# Patient Record
Sex: Male | Born: 1935 | Race: White | Hispanic: No | Smoking: Never smoker
Health system: Southern US, Community
[De-identification: ages and names within clinical notes are randomized; demographics above are authoritative.]

## PROBLEM LIST (undated history)

## (undated) DIAGNOSIS — E0590 Thyrotoxicosis, unspecified without thyrotoxic crisis or storm: Secondary | ICD-10-CM

## (undated) DIAGNOSIS — R413 Other amnesia: Secondary | ICD-10-CM

## (undated) DIAGNOSIS — I10 Essential (primary) hypertension: Secondary | ICD-10-CM

## (undated) DIAGNOSIS — N200 Calculus of kidney: Secondary | ICD-10-CM

## (undated) HISTORY — DX: Other amnesia: R41.3

## (undated) HISTORY — DX: Thyrotoxicosis, unspecified without thyrotoxic crisis or storm: E05.90

## (undated) HISTORY — DX: Calculus of kidney: N20.0

---

## 2005-03-08 ENCOUNTER — Ambulatory Visit: Payer: Self-pay | Admitting: Family Medicine

## 2005-06-12 ENCOUNTER — Emergency Department: Payer: Self-pay | Admitting: Emergency Medicine

## 2005-06-15 ENCOUNTER — Emergency Department: Payer: Self-pay | Admitting: Emergency Medicine

## 2005-06-19 ENCOUNTER — Emergency Department: Payer: Self-pay | Admitting: Emergency Medicine

## 2005-06-26 ENCOUNTER — Emergency Department: Payer: Self-pay | Admitting: Emergency Medicine

## 2005-07-10 ENCOUNTER — Emergency Department: Payer: Self-pay | Admitting: Emergency Medicine

## 2006-12-08 ENCOUNTER — Emergency Department: Payer: Self-pay | Admitting: Unknown Physician Specialty

## 2006-12-08 ENCOUNTER — Other Ambulatory Visit: Payer: Self-pay

## 2008-09-03 ENCOUNTER — Ambulatory Visit: Payer: Self-pay | Admitting: Surgery

## 2008-09-03 ENCOUNTER — Other Ambulatory Visit: Payer: Self-pay

## 2008-09-10 ENCOUNTER — Ambulatory Visit: Payer: Self-pay | Admitting: Surgery

## 2012-10-17 ENCOUNTER — Ambulatory Visit: Payer: Self-pay | Admitting: Ophthalmology

## 2012-11-14 ENCOUNTER — Ambulatory Visit: Payer: Self-pay | Admitting: Ophthalmology

## 2012-11-28 HISTORY — PX: EYE SURGERY: SHX253

## 2012-11-28 HISTORY — PX: HERNIA REPAIR: SHX51

## 2013-01-17 ENCOUNTER — Emergency Department: Payer: Self-pay | Admitting: Emergency Medicine

## 2013-01-17 LAB — COMPREHENSIVE METABOLIC PANEL
BUN: 13 mg/dL (ref 7–18)
Bilirubin,Total: 0.5 mg/dL (ref 0.2–1.0)
Calcium, Total: 8.5 mg/dL (ref 8.5–10.1)
Chloride: 110 mmol/L — ABNORMAL HIGH (ref 98–107)
Co2: 25 mmol/L (ref 21–32)
Creatinine: 1.15 mg/dL (ref 0.60–1.30)
EGFR (African American): >60
EGFR (Non-African Amer.): 58 — ABNORMAL LOW
Glucose: 134 mg/dL — ABNORMAL HIGH (ref 65–99)
Osmolality: 283 (ref 275–301)
Sodium: 141 mmol/L (ref 136–145)
Total Protein: 6.8 g/dL (ref 6.4–8.2)

## 2013-01-17 LAB — URINALYSIS, COMPLETE
Glucose,UR: NEGATIVE mg/dL (ref 0–75)
Hyaline Cast: 1
Leukocyte Esterase: NEGATIVE
Ph: 6 (ref 4.5–8.0)
Protein: NEGATIVE
RBC,UR: 5 /HPF (ref 0–5)
Specific Gravity: 1.021 (ref 1.003–1.030)
Squamous Epithelial: <1
WBC UR: 2 /HPF (ref 0–5)

## 2013-01-17 LAB — CBC
HGB: 14.4 g/dL (ref 13.0–18.0)
MCH: 31.9 pg (ref 26.0–34.0)
MCV: 90 fL (ref 80–100)
RBC: 4.52 10*6/uL (ref 4.40–5.90)
WBC: 5.3 10*3/uL (ref 3.8–10.6)

## 2013-01-17 LAB — TROPONIN I: Troponin-I: <0.02 ng/mL

## 2014-09-30 ENCOUNTER — Inpatient Hospital Stay: Payer: Self-pay | Admitting: Surgery

## 2014-09-30 LAB — COMPREHENSIVE METABOLIC PANEL
ALT: 15 U/L
Albumin: 3.7 g/dL (ref 3.4–5.0)
Alkaline Phosphatase: 75 U/L
Anion Gap: 7 (ref 7–16)
BILIRUBIN TOTAL: 0.6 mg/dL (ref 0.2–1.0)
BUN: 14 mg/dL (ref 7–18)
CALCIUM: 8.6 mg/dL (ref 8.5–10.1)
CREATININE: 1.23 mg/dL (ref 0.60–1.30)
Chloride: 107 mmol/L (ref 98–107)
Co2: 29 mmol/L (ref 21–32)
GFR CALC NON AF AMER: 59 — AB
Glucose: 108 mg/dL — ABNORMAL HIGH (ref 65–99)
Osmolality: 286 (ref 275–301)
Potassium: 3.9 mmol/L (ref 3.5–5.1)
SGOT(AST): 28 U/L (ref 15–37)
SODIUM: 143 mmol/L (ref 136–145)
TOTAL PROTEIN: 6.9 g/dL (ref 6.4–8.2)

## 2014-09-30 LAB — CBC
HCT: 40.2 % (ref 40.0–52.0)
HGB: 13.8 g/dL (ref 13.0–18.0)
MCH: 31.7 pg (ref 26.0–34.0)
MCHC: 34.4 g/dL (ref 32.0–36.0)
MCV: 92 fL (ref 80–100)
Platelet: 129 10*3/uL — ABNORMAL LOW (ref 150–440)
RBC: 4.37 10*6/uL — ABNORMAL LOW (ref 4.40–5.90)
RDW: 13.1 % (ref 11.5–14.5)
WBC: 5.1 10*3/uL (ref 3.8–10.6)

## 2014-09-30 LAB — PROTIME-INR
INR: 1
Prothrombin Time: 13.1 secs (ref 11.5–14.7)

## 2014-10-01 LAB — BASIC METABOLIC PANEL
Anion Gap: 6 — ABNORMAL LOW (ref 7–16)
BUN: 11 mg/dL (ref 7–18)
CHLORIDE: 111 mmol/L — AB (ref 98–107)
Calcium, Total: 8 mg/dL — ABNORMAL LOW (ref 8.5–10.1)
Co2: 27 mmol/L (ref 21–32)
Creatinine: 1.2 mg/dL (ref 0.60–1.30)
EGFR (African American): >60
EGFR (Non-African Amer.): >60
Glucose: 98 mg/dL (ref 65–99)
OSMOLALITY: 286 (ref 275–301)
Potassium: 3.7 mmol/L (ref 3.5–5.1)
SODIUM: 144 mmol/L (ref 136–145)

## 2014-10-01 LAB — CBC WITH DIFFERENTIAL/PLATELET
Basophil #: 0 10*3/uL (ref 0.0–0.1)
Basophil %: 0.8 %
EOS PCT: 5.7 %
Eosinophil #: 0.2 10*3/uL (ref 0.0–0.7)
HCT: 35.3 % — AB (ref 40.0–52.0)
HGB: 12.4 g/dL — AB (ref 13.0–18.0)
Lymphocyte #: 0.9 10*3/uL — ABNORMAL LOW (ref 1.0–3.6)
Lymphocyte %: 20.7 %
MCH: 32.3 pg (ref 26.0–34.0)
MCHC: 35 g/dL (ref 32.0–36.0)
MCV: 92 fL (ref 80–100)
Monocyte #: 0.4 x10 3/mm (ref 0.2–1.0)
Monocyte %: 8.4 %
NEUTROS ABS: 2.7 10*3/uL (ref 1.4–6.5)
Neutrophil %: 64.4 %
Platelet: 109 10*3/uL — ABNORMAL LOW (ref 150–440)
RBC: 3.83 10*6/uL — AB (ref 4.40–5.90)
RDW: 13.2 % (ref 11.5–14.5)
WBC: 4.2 10*3/uL (ref 3.8–10.6)

## 2014-10-01 LAB — URINALYSIS, COMPLETE
BACTERIA: NONE SEEN
Bilirubin,UR: NEGATIVE
Blood: NEGATIVE
Glucose,UR: NEGATIVE mg/dL (ref 0–75)
KETONE: NEGATIVE
Leukocyte Esterase: NEGATIVE
NITRITE: NEGATIVE
PROTEIN: NEGATIVE
Ph: 6 (ref 4.5–8.0)
SPECIFIC GRAVITY: 1.014 (ref 1.003–1.030)
Squamous Epithelial: 1
WBC UR: NONE SEEN /HPF (ref 0–5)

## 2014-10-03 LAB — CBC WITH DIFFERENTIAL/PLATELET
BASOS ABS: 0 10*3/uL (ref 0.0–0.1)
BASOS PCT: 0.6 %
EOS ABS: 0.1 10*3/uL (ref 0.0–0.7)
Eosinophil %: 1.5 %
HCT: 27.7 % — ABNORMAL LOW (ref 40.0–52.0)
HGB: 9.7 g/dL — ABNORMAL LOW (ref 13.0–18.0)
LYMPHS ABS: 0.3 10*3/uL — AB (ref 1.0–3.6)
LYMPHS PCT: 5.2 %
MCH: 32.5 pg (ref 26.0–34.0)
MCHC: 35.1 g/dL (ref 32.0–36.0)
MCV: 92 fL (ref 80–100)
Monocyte #: 0.4 x10 3/mm (ref 0.2–1.0)
Monocyte %: 6.2 %
NEUTROS ABS: 5.4 10*3/uL (ref 1.4–6.5)
NEUTROS PCT: 86.5 %
Platelet: 106 10*3/uL — ABNORMAL LOW (ref 150–440)
RBC: 2.99 10*6/uL — ABNORMAL LOW (ref 4.40–5.90)
RDW: 13.3 % (ref 11.5–14.5)
WBC: 6.3 10*3/uL (ref 3.8–10.6)

## 2014-10-05 LAB — CBC WITH DIFFERENTIAL/PLATELET
Basophil #: 0 10*3/uL (ref 0.0–0.1)
Basophil %: 0.7 %
Eosinophil #: 0.1 10*3/uL (ref 0.0–0.7)
Eosinophil %: 1.8 %
HCT: 24.3 % — ABNORMAL LOW (ref 40.0–52.0)
HGB: 8.8 g/dL — AB (ref 13.0–18.0)
LYMPHS ABS: 0.4 10*3/uL — AB (ref 1.0–3.6)
LYMPHS PCT: 10.7 %
MCH: 32.4 pg (ref 26.0–34.0)
MCHC: 36.3 g/dL — ABNORMAL HIGH (ref 32.0–36.0)
MCV: 89 fL (ref 80–100)
MONOS PCT: 8.8 %
Monocyte #: 0.3 x10 3/mm (ref 0.2–1.0)
NEUTROS ABS: 2.7 10*3/uL (ref 1.4–6.5)
NEUTROS PCT: 78 %
PLATELETS: 117 10*3/uL — AB (ref 150–440)
RBC: 2.73 10*6/uL — AB (ref 4.40–5.90)
RDW: 13.4 % (ref 11.5–14.5)
WBC: 3.5 10*3/uL — AB (ref 3.8–10.6)

## 2014-10-06 LAB — CREATININE, SERUM
Creatinine: 1.26 mg/dL (ref 0.60–1.30)
EGFR (Non-African Amer.): 58 — ABNORMAL LOW

## 2014-10-14 ENCOUNTER — Ambulatory Visit: Payer: Self-pay | Admitting: Family Medicine

## 2015-03-21 NOTE — Discharge Summary (Signed)
PATIENT NAME:  Isaiah Mckenzie, Caprice BeaverJESSIE N MR#:  [id number] DATE OF BIRTH:  03-Apr-1927  DATE OF ADMISSION:  09/30/2014 DATE OF DISCHARGE:  10/06/2014  BRIEF HISTORY: Mr. Mardene SpeakJessie Faxon is an 81 year old gentleman seen in the Emergency Room with an incarcerated left inguinal hernia. The hernia was reduced in the Emergency Room without difficulty, and he was set up for elective intervention the following day after discussion with the patient and his family. He did have some mild dementia but his family was involved in the decision.  HOSPITAL COURSE: He was taken to surgery on November 4, where he underwent an open left inguinal hernia repair. There was a huge peritoneal sac which required extensive dissection. He developed a significant postoperative hematoma, dropping his blood count 2 grams. His symptoms improved. He refused rehabilitation and was set up for discharge with home health. Discharged home on the 9th.  FOLLOWUP: To be followed in the office in 7 to 10 days' time.  DISCHARGE INSTRUCTIONS: Bathing, activity, and driving instructions were given to the patient.   DISCHARGE MEDICATIONS: He is to resume his medications, including aspirin 81 mg p.o. once a day, lisinopril 10 mg p.o. once a day, and he was discharged home on Vicodin 5/325 every 4 to 6 hours p.r.n.   FINAL DISCHARGE DIAGNOSES: 1.  Incarcerated left inguinal hernia. 2.  Postoperative hematoma.  SURGERY: Left inguinal hernia repair.   ____________________________ Carmie Endalph L. Ely III, MD rle:ST D: 10/13/2014 22:06:25 ET T: 10/13/2014 23:38:43 ET JOB#: [id number]  cc: Quentin Orealph L. Ely III, MD, <Dictator> Quentin OreALPH L ELY MD ELECTRONICALLY SIGNED 10/15/2014 20:02

## 2015-03-21 NOTE — H&P (Signed)
PATIENT NAME:  Isaiah Mckenzie MR#:  [id number] DATE OF BIRTH:  July 17, 1927  DATE OF ADMISSION:  09/30/2014  [facility]   ADMITTING PHYSICIAN:   Tiney Rougealph Ely, III, M.D.  CHIEF COMPLAINT: Incarcerated left inguinal hernia.   BRIEF HISTORY: Isaiah Mckenzie is an 81 year old gentleman previously seen in the office with symptomatic left inguinal hernia. He had had a previous right inguinal hernia repair approximately 6 years ago with a laparoscopic approach and had no significant postoperative problems. The patient is in generally good health but decided against having a repair at the time as he was minimally symptomatic. Over the last several days, he has had increasing left groin pain. At approximately mid afternoon today developed sudden left groin pain with a large mass in the left groin area. He presented to the Emergency Room for further evaluation.  Laboratory values were largely unremarkable. The hernia could not be reduced and the surgical service was consulted.   He has no other history of abdominal surgery or abdominal problems other than the previous right inguinal hernia repair. He denies history of hepatitis, yellow jaundice, pancreatitis, peptic ulcer disease, gallbladder disease or diverticulitis.  He has a history of stroke and hypertension, but no cardiac disease, diabetes or thyroid problems.   CURRENT MEDICATIONS:  Include lisinopril 10 mg once a day and aspirin 81 mg once a day.  [facility].   REVIEW OF SYSTEMS:  Otherwise unremarkable with the exception of some difficulty voiding.   PHYSICAL EXAMINATION: GENERAL: He is extremely hard of hearing. Difficult to communicate with. Complaining of groin pain. Cannot really get a pain scale from him.  VITAL SIGNS:  He is afebrile. Blood pressure is 139/80. Heart rate 65 and regular. He is afebrile.  HEENT: Unremarkable. He has no scleral icterus. No pupillary  abnormalities.  NECK:  Supple, nontender, with midline trachea.  CHEST:  Clear with very distant breath sounds. He has normal pulmonary excursion.  CARDIAC:  Reveals a 2/6 systolic murmur with no gallops and normal sinus rhythm. Abdomen:  Soft with no other abdominal tenderness.  He does have a large incarcerated left inguinal hernia. With gentle manipulation the hernia was reduced. He has shrunken testicles on both sides.  EXTREMITIES:  Exam reveals full range of motion.  PSYCHIATRIC: Normal orientation. Normal affect. Extremely hard of hearing so difficult to communicate with.   I talked with the patient's family in detail. He is now symptomatic with an incarceration without evidence of strangulation.  The incarceration has been reduced.  Will plan to admit him to the hospital and plan elective surgical repair after he is otherwise stable.    This plan has been outlined to them in detail and they are in agreement.    ____________________________ Carmie Endalph L. Ely III, MD rle:jw D: 09/30/2014 18:37:18 ET T: 09/30/2014 21:40:42 ET JOB#: [id number]  cc: Quentin Orealph L. Ely III, MD, <Dictator> Quentin OreALPH L ELY MD ELECTRONICALLY SIGNED 10/08/2014 13:48

## 2015-03-21 NOTE — Op Note (Signed)
PATIENT NAME:  Isaiah Mckenzie, Isaiah Mckenzie MR#:  [id number] DATE OF BIRTH:  22-Dec-1926  DATE OF PROCEDURE:  10/01/2014  PREOPERATIVE DIAGNOSIS: Left inguinal hernia.   POSTOPERATIVE DIAGNOSIS:  Left inguinal hernia.  OPERATION: Left inguinal hernia repair.  OPERATIVE PROCEDURE: With the patient in the supine position after induction of appropriate general anesthesia, the patient's groin was prepped with ChloraPrep and draped with sterile towels. A transverse incision parallel to the inguinal ligament was carried down through the subcutaneous tissue sharply, and then the Scarpa fascia was divided with the Bovie. Anterior fascia was identified and opened through the external ring. The sac was mobilized from the cord structures. The sac was quite large but did not contain any evidence of bowel at this time. The bowel had been previously reduced. The sac was dissected back to its base and the internal ring suture ligated with 0 Vicryl and divided. The repair was then carried out. The inguinal floor was reinforced to the transversalis fascia using running suture of 3-0 Prolene. Conjoined tendon was then sutured to the shelving edge of Poupart ligament using 0 Surgilon. The cord structures were returned to their anatomic position. The external oblique was approximated over the cord structures using a running suture of 3-0 Vicryl. The area was infiltrated with 0.5% Marcaine for postoperative pain control. The subcutaneous space was obliterated with 3-0 Vicryl and skin was clipped. Sterile dressings were applied. The patient was returned to the recovery room, having tolerated the procedure well. Sponge, instrument and needle counts were correct x2 in the operating room.    ____________________________ Quentin Orealph L. Ely III, MD rle:jp D: 10/01/2014 09:33:25 ET T: 10/01/2014 11:24:26 ET JOB#: [id number]  cc: Quentin Orealph L. Ely III, MD, <Dictator> Quentin OreALPH L ELY MD ELECTRONICALLY SIGNED 10/08/2014 13:48

## 2016-01-26 ENCOUNTER — Emergency Department: Payer: Medicare Other

## 2016-01-26 ENCOUNTER — Observation Stay
Admission: EM | Admit: 2016-01-26 | Discharge: 2016-01-28 | Disposition: A | Discharge status: Home / Self Care | Payer: Medicare Other | Attending: Specialist | Admitting: Specialist

## 2016-01-26 ENCOUNTER — Encounter: Payer: Self-pay | Admitting: Emergency Medicine

## 2016-01-26 DIAGNOSIS — R1084 Generalized abdominal pain: Secondary | ICD-10-CM | POA: Diagnosis present

## 2016-01-26 DIAGNOSIS — I10 Essential (primary) hypertension: Secondary | ICD-10-CM | POA: Diagnosis not present

## 2016-01-26 DIAGNOSIS — Z9889 Other specified postprocedural states: Secondary | ICD-10-CM | POA: Insufficient documentation

## 2016-01-26 DIAGNOSIS — N132 Hydronephrosis with renal and ureteral calculous obstruction: Secondary | ICD-10-CM | POA: Insufficient documentation

## 2016-01-26 DIAGNOSIS — F17200 Nicotine dependence, unspecified, uncomplicated: Secondary | ICD-10-CM | POA: Diagnosis not present

## 2016-01-26 DIAGNOSIS — F0390 Unspecified dementia without behavioral disturbance: Secondary | ICD-10-CM | POA: Insufficient documentation

## 2016-01-26 DIAGNOSIS — R112 Nausea with vomiting, unspecified: Secondary | ICD-10-CM

## 2016-01-26 DIAGNOSIS — Z79899 Other long term (current) drug therapy: Secondary | ICD-10-CM | POA: Insufficient documentation

## 2016-01-26 DIAGNOSIS — K7689 Other specified diseases of liver: Secondary | ICD-10-CM | POA: Insufficient documentation

## 2016-01-26 DIAGNOSIS — N200 Calculus of kidney: Secondary | ICD-10-CM | POA: Diagnosis present

## 2016-01-26 DIAGNOSIS — D696 Thrombocytopenia, unspecified: Secondary | ICD-10-CM | POA: Insufficient documentation

## 2016-01-26 DIAGNOSIS — N179 Acute kidney failure, unspecified: Principal | ICD-10-CM | POA: Insufficient documentation

## 2016-01-26 DIAGNOSIS — Z7982 Long term (current) use of aspirin: Secondary | ICD-10-CM | POA: Insufficient documentation

## 2016-01-26 HISTORY — DX: Essential (primary) hypertension: I10

## 2016-01-26 LAB — TROPONIN I: Troponin I: <0.03 ng/mL (ref <0.031)

## 2016-01-26 LAB — COMPREHENSIVE METABOLIC PANEL
ALT: 10 U/L — ABNORMAL LOW (ref 17–63)
ANION GAP: 7 (ref 5–15)
AST: 28 U/L (ref 15–41)
Albumin: 4 g/dL (ref 3.5–5.0)
Alkaline Phosphatase: 102 U/L (ref 38–126)
BILIRUBIN TOTAL: 1.8 mg/dL — AB (ref 0.3–1.2)
BUN: 24 mg/dL — ABNORMAL HIGH (ref 6–20)
CO2: 26 mmol/L (ref 22–32)
Calcium: 9.3 mg/dL (ref 8.9–10.3)
Chloride: 106 mmol/L (ref 101–111)
Creatinine, Ser: 2.28 mg/dL — ABNORMAL HIGH (ref 0.61–1.24)
GFR calc Af Amer: 28 mL/min — ABNORMAL LOW (ref >60)
GFR, EST NON AFRICAN AMERICAN: 24 mL/min — AB (ref >60)
Glucose, Bld: 113 mg/dL — ABNORMAL HIGH (ref 65–99)
POTASSIUM: 4.3 mmol/L (ref 3.5–5.1)
Sodium: 139 mmol/L (ref 135–145)
TOTAL PROTEIN: 6.9 g/dL (ref 6.5–8.1)

## 2016-01-26 LAB — CBC
HEMATOCRIT: 42.3 % (ref 40.0–52.0)
HEMOGLOBIN: 15.3 g/dL (ref 13.0–18.0)
MCH: 31.9 pg (ref 26.0–34.0)
MCHC: 36.1 g/dL — ABNORMAL HIGH (ref 32.0–36.0)
MCV: 88.5 fL (ref 80.0–100.0)
Platelets: 159 10*3/uL (ref 150–440)
RBC: 4.79 MIL/uL (ref 4.40–5.90)
RDW: 13.2 % (ref 11.5–14.5)
WBC: 7.6 10*3/uL (ref 3.8–10.6)

## 2016-01-26 LAB — URINALYSIS COMPLETE WITH MICROSCOPIC (ARMC ONLY)
BACTERIA UA: NONE SEEN
Bilirubin Urine: NEGATIVE
GLUCOSE, UA: NEGATIVE mg/dL
LEUKOCYTES UA: NEGATIVE
NITRITE: NEGATIVE
PROTEIN: NEGATIVE mg/dL
Specific Gravity, Urine: 1.014 (ref 1.005–1.030)
WBC, UA: NONE SEEN WBC/hpf (ref 0–5)
pH: 6 (ref 5.0–8.0)

## 2016-01-26 LAB — LIPASE, BLOOD: Lipase: 26 U/L (ref 11–51)

## 2016-01-26 MED ORDER — ALBUTEROL SULFATE (2.5 MG/3ML) 0.083% IN NEBU: 2.5000 mg | INHALATION_SOLUTION | RESPIRATORY_TRACT | Status: DC | PRN

## 2016-01-26 MED ORDER — ONDANSETRON HCL 4 MG/2ML IJ SOLN: 4.0000 mg | Freq: Four times a day (QID) | INTRAMUSCULAR | Status: DC | PRN

## 2016-01-26 MED ORDER — ACETAMINOPHEN 325 MG PO TABS: 650.0000 mg | ORAL_TABLET | Freq: Four times a day (QID) | ORAL | Status: DC | PRN

## 2016-01-26 MED ORDER — HALOPERIDOL LACTATE 5 MG/ML IJ SOLN
1.0000 mg | Freq: Four times a day (QID) | INTRAMUSCULAR | Status: DC | PRN
  Filled 2016-01-26: qty 1

## 2016-01-26 MED ORDER — HEPARIN SODIUM (PORCINE) 5000 UNIT/ML IJ SOLN
5000.0000 [IU] | Freq: Three times a day (TID) | INTRAMUSCULAR | Status: DC
  Administered 2016-01-26 – 2016-01-28 (×6): 5000 [IU] via SUBCUTANEOUS
  Filled 2016-01-26 (×6): qty 1

## 2016-01-26 MED ORDER — SODIUM CHLORIDE 0.9 % IV SOLN
INTRAVENOUS | Status: DC
  Administered 2016-01-26 – 2016-01-27 (×2): via INTRAVENOUS

## 2016-01-26 MED ORDER — INFLUENZA VAC SPLIT QUAD 0.5 ML IM SUSY
0.5000 mL | PREFILLED_SYRINGE | INTRAMUSCULAR | Status: DC
  Filled 2016-01-26: qty 0.5

## 2016-01-26 MED ORDER — ONDANSETRON HCL 4 MG PO TABS: 4.0000 mg | ORAL_TABLET | Freq: Four times a day (QID) | ORAL | Status: DC | PRN

## 2016-01-26 MED ORDER — FENTANYL CITRATE (PF) 100 MCG/2ML IJ SOLN
12.5000 ug | Freq: Once | INTRAMUSCULAR | Status: AC
  Administered 2016-01-26: 12.5 ug via INTRAVENOUS
  Filled 2016-01-26: qty 2

## 2016-01-26 MED ORDER — ACETAMINOPHEN 650 MG RE SUPP: 650.0000 mg | Freq: Four times a day (QID) | RECTAL | Status: DC | PRN

## 2016-01-26 MED ORDER — LORAZEPAM 2 MG/ML IJ SOLN
0.5000 mg | Freq: Once | INTRAMUSCULAR | Status: AC
  Administered 2016-01-26: 0.5 mg via INTRAVENOUS
  Filled 2016-01-26: qty 1

## 2016-01-26 MED ORDER — ONDANSETRON HCL 4 MG/2ML IJ SOLN
4.0000 mg | Freq: Once | INTRAMUSCULAR | Status: AC
  Administered 2016-01-26: 4 mg via INTRAVENOUS
  Filled 2016-01-26: qty 2

## 2016-01-26 MED ORDER — IOHEXOL 240 MG/ML SOLN
25.0000 mL | INTRAMUSCULAR | Status: AC
  Administered 2016-01-26: 50 mL via ORAL
  Administered 2016-01-26: 25 mL via ORAL

## 2016-01-26 MED ORDER — ASPIRIN EC 81 MG PO TBEC
81.0000 mg | DELAYED_RELEASE_TABLET | Freq: Every day | ORAL | Status: DC
Start: 2016-01-27 — End: 2016-01-28
  Administered 2016-01-27 – 2016-01-28 (×2): 81 mg via ORAL
  Filled 2016-01-26 (×2): qty 1

## 2016-01-26 MED ORDER — PNEUMOCOCCAL VAC POLYVALENT 25 MCG/0.5ML IJ INJ
0.5000 mL | INJECTION | INTRAMUSCULAR | Status: DC
  Filled 2016-01-26: qty 0.5

## 2016-01-26 MED ORDER — SODIUM CHLORIDE 0.9 % IV BOLUS (SEPSIS)
1000.0000 mL | Freq: Once | INTRAVENOUS | Status: AC
  Administered 2016-01-26: 1000 mL via INTRAVENOUS

## 2016-01-26 NOTE — ED Notes (Signed)
Patient transported to CT 

## 2016-01-26 NOTE — ED Notes (Signed)
Pt resting in bed, daughter at bedside.  

## 2016-01-26 NOTE — H&P (Signed)
[facility] [place] [facility]   PATIENT NAME: Isaiah Mckenzie    MR#:  [id number]  DATE OF BIRTH:  15-Mar-1927  DATE OF ADMISSION:  01/26/2016  PRIMARY CARE PHYSICIAN: No primary care provider on file.   REQUESTING/REFERRING PHYSICIAN: Myrna Blazer, MD  CHIEF COMPLAINT:   Chief Complaint  Patient presents with  . Emesis  nausea and vomiting 2-3 days.  HISTORY OF PRESENT ILLNESS:  Isaiah Mckenzie  is a 82 y.o. male with a known history of HTN and possible dementia. The patient has also been holding his abdomen and moaning. The patient was give sedation due to agitation in the ED. He cannot communicate. CT shows 2 mm distal right ureteral calculus resulting in mild right hydroureteronephrosis. Cr. Elevated to 2.28.  PAST MEDICAL HISTORY:   Past Medical History  Diagnosis Date  . Hypertension     PAST SURGICAL HISTORY:   Past Surgical History  Procedure Laterality Date  . Eye surgery    . Hernia repair      SOCIAL HISTORY:   Social History  Substance Use Topics  . Smoking status: Never Smoker   . Smokeless tobacco: Current User  . Alcohol Use: No    FAMILY HISTORY:  History reviewed. No pertinent family history. sisters has cancer per his daughter.  DRUG ALLERGIES:  No Known Allergies  REVIEW OF SYSTEMS:  Unable to get.  MEDICATIONS AT HOME:   Prior to Admission medications   Medication Sig Start Date End Date Taking? Authorizing Provider  aspirin EC 81 MG tablet Take 1 tablet by mouth daily.   Yes Historical Provider, MD  lisinopril (PRINIVIL,ZESTRIL) 10 MG tablet Take 1 tablet by mouth daily. 11/24/15 11/23/16 Yes Historical Provider, MD      VITAL SIGNS:  Blood pressure 136/65, pulse 67, temperature 98.7 F (37.1 C), temperature source Oral, resp. rate 14, height  (1.702 m), weight 58.968 kg (130 lb), SpO2 99 %.  PHYSICAL EXAMINATION:  GENERAL:  82 y.o.-year-old patient lying in the bed with no acute  distress.  EYES: Pupils equal, round, reactive to light and accommodation. No scleral icterus. Extraocular muscles intact.  HEENT: Head atraumatic, normocephalic. Dry oral mucosa.  NECK:  Supple, no jugular venous distention. No thyroid enlargement, no tenderness.  LUNGS: Normal breath sounds bilaterally, no wheezing, rales,rhonchi or crepitation. No use of accessory muscles of respiration.  CARDIOVASCULAR: S1, S2 normal. No murmurs, rubs, or gallops.  ABDOMEN: Soft, nontender, nondistended. Bowel sounds present. No organomegaly or mass.  EXTREMITIES: No pedal edema, cyanosis, or clubbing.  NEUROLOGIC: not follow commands. PSYCHIATRIC: The patient is confused and agitated. SKIN: No obvious rash, lesion, or ulcer.   LABORATORY PANEL:   CBC  Recent Labs Lab 01/26/16 0946  WBC 7.6  HGB 15.3  HCT 42.3  PLT 159   ------------------------------------------------------------------------------------------------------------------  Chemistries   Recent Labs Lab 01/26/16 0946  NA 139  K 4.3  CL 106  CO2 26  GLUCOSE 113*  BUN 24*  CREATININE 2.28*  CALCIUM 9.3  AST 28  ALT 10*  ALKPHOS 102  BILITOT 1.8*   ------------------------------------------------------------------------------------------------------------------  Cardiac Enzymes  Recent Labs Lab 01/26/16 0946  TROPONINI <0.03   ------------------------------------------------------------------------------------------------------------------  RADIOLOGY:  Ct Abdomen Pelvis Wo Contrast  01/26/2016  CLINICAL DATA:  Two days of vomiting and decreased appetite. Diffuse abdominal pain. EXAM: CT ABDOMEN AND PELVIS WITHOUT CONTRAST TECHNIQUE: Multidetector CT imaging of the abdomen and pelvis was performed following the standard protocol without IV contrast. COMPARISON:  03/08/2005 FINDINGS: Lower chest:  Lung bases are clear.  Normal heart size. Hepatobiliary: Multiple hypodense, fluid attenuating hepatic masses which  appear enlarged compared with the prior exam of 03/08/2005, but likely represent cysts. Normal gallbladder. Pancreas: Normal. Spleen: Normal. Adrenals/Urinary Tract: Normal adrenal glands. 1.5 cm hypodense, fluid attenuating left renal mass most consistent with a cyst. Mild right hydroureteronephrosis. 2 mm distal right ureteral calculus. Prostatic enlargement impressing upon the bladder base. Normal bladder. Stomach/Bowel: No bowel dilatation or bowel wall thickening. No pneumatosis, pneumoperitoneum or portal venous gas. Diverticulosis without evidence of diverticulitis. Prior right hernia repair. Vascular/Lymphatic: Normal caliber abdominal aorta with atherosclerosis. No lymphadenopathy. Other: No fluid collection or hematoma. Musculoskeletal: No lytic or sclerotic osseous lesion. No acute osseous abnormality. Degenerative disc disease with disc height loss at L5-S1 with bilateral facet arthropathy. IMPRESSION: 1. 2 mm distal right ureteral calculus resulting in mild right hydroureteronephrosis. 2. Multiple hepatic cysts enlarged compared with 03/08/2005. Electronically Signed   By: Elige Ko   On: 01/26/2016 14:47    EKG:   Orders placed or performed during the hospital encounter of 01/26/16  . ED EKG  . ED EKG  . EKG 12-Lead  . EKG 12-Lead    IMPRESSION AND PLAN:   ARF. Hold lisinopril. Give NS IV, f/u BMP.  Nephrolithiasis with Mild hydronephrosis.  Per on-call urologist, no procedure planned.  Dementia with delirium. Precaution and haldo prn.  Elevated bili. Possible due to vomiting.  HTN. Hold lisinopril. Controlled.    All the records are reviewed and case discussed with ED provider. Management plans discussed with the patient's daughter and they are in agreement.  CODE STATUS: full code.  TOTAL TIME TAKING CARE OF THIS PATIENT: 56 minutes.    Shaune Pollack M.D on 01/26/2016 at 4:38 PM  Between 7am to 6pm - Pager - [phone]  After 6pm go to [email or web address] -  password [email or web address] [facility]  [facility] [facility]  Office  [phone]  CC: Primary care physician; No primary care provider on file.

## 2016-01-26 NOTE — ED Notes (Signed)
Pt has had 2 days of vomiting and decreased appetite. Has been having increasing weakness per daughter.

## 2016-01-26 NOTE — ED Provider Notes (Addendum)
[facility] Emergency Department Provider Note  ____________________________________________  Time seen: Approximately 1115PM  I have reviewed the triage vital signs and the nursing notes.   HISTORY  Chief Complaint Emesis    HPI Isaiah Mckenzie is a 82 y.o. male with a history of hypertension who is presenting today with 2 days of vomiting. The patient has also been holding his abdomen and moaning. He is here with his daughter says the patient does not of his hearing aids in and therefore is unable to hear. She says that he usually does not complain. She says that he does have the beginning stages of dementia and has been forgetful. She does not know of any diarrhea. No known sick contacts.   Past Medical History  Diagnosis Date  . Hypertension     There are no active problems to display for this patient.   Past Surgical History  Procedure Laterality Date  . Eye surgery    . Hernia repair      Current Outpatient Rx  Name  Route  Sig  Dispense  Refill  . aspirin EC 81 MG tablet   Oral   Take 1 tablet by mouth daily.         Marland Kitchen lisinopril (PRINIVIL,ZESTRIL) 10 MG tablet   Oral   Take 1 tablet by mouth daily.           Allergies Review of patient's allergies indicates no known allergies.  History reviewed. No pertinent family history.  Social History Social History  Substance Use Topics  . Smoking status: Never Smoker   . Smokeless tobacco: Current User  . Alcohol Use: No    Review of Systems Constitutional: No fever/chills Eyes: No visual changes. ENT: No sore throat. Cardiovascular: Denies chest pain. Respiratory: Denies shortness of breath. Gastrointestinal:  No diarrhea.  No constipation. Genitourinary: Negative for dysuria. Musculoskeletal: Negative for back pain. Skin: Negative for rash. Neurological: Negative for headaches, focal weakness or numbness.  10-point ROS otherwise  negative.  ____________________________________________   PHYSICAL EXAM:  VITAL SIGNS: ED Triage Vitals  Enc Vitals Group     BP 01/26/16 0948 154/88 mmHg     Pulse Rate 01/26/16 0948 63     Resp 01/26/16 0948 14     Temp 01/26/16 0939 98.7 F (37.1 C)     Temp Source 01/26/16 0939 Oral     SpO2 01/26/16 0948 99 %     Weight 01/26/16 0948 130 lb (58.968 kg)     Height 01/26/16 0939  (1.702 m)     Head Cir --      Peak Flow --      Pain Score --      Pain Loc --      Pain Edu? --      Excl. in GC? --     Constitutional: Alert.  in no acute distress. Eyes: Conjunctivae are normal. PERRL. EOMI. Head: Atraumatic. Nose: No congestion/rhinnorhea. Mouth/Throat: Mucous membranes are moist.  Oropharynx non-erythematous. Neck: No stridor.   Cardiovascular: Normal rate, regular rhythm. Grossly normal heart sounds.  Good peripheral circulation. Respiratory: Normal respiratory effort.  No retractions. Lungs CTAB. Gastrointestinal: Soft with diffuse tenderness palpation. No distention. No abdominal bruits. No CVA tenderness. Musculoskeletal: No lower extremity tenderness nor edema.  No joint effusions. Neurologic:  No gross focal neurologic deficits are appreciated. Skin:  Skin is warm, dry and intact. No rash noted. Psychiatric: Mood and affect are normal. Speech and behavior are normal.  ____________________________________________  LABS (all labs ordered are listed, but only abnormal results are displayed)  Labs Reviewed  COMPREHENSIVE METABOLIC PANEL - Abnormal; Notable for the following:    Glucose, Bld 113 (*)    BUN 24 (*)    Creatinine, Ser 2.28 (*)    ALT 10 (*)    Total Bilirubin 1.8 (*)    GFR calc non Af Amer 24 (*)    GFR calc Af Amer 28 (*)    All other components within normal limits  CBC - Abnormal; Notable for the following:    MCHC 36.1 (*)    All other components within normal limits  URINALYSIS COMPLETEWITH MICROSCOPIC ([facility] ONLY) - Abnormal;  Notable for the following:    Color, Urine YELLOW (*)    APPearance CLEAR (*)    Ketones, ur TRACE (*)    Hgb urine dipstick 2+ (*)    Squamous Epithelial / LPF 0-5 (*)    All other components within normal limits  LIPASE, BLOOD  TROPONIN I   ____________________________________________  EKG  ED ECG REPORT I, Arelia Longest, the attending physician, personally viewed and interpreted this ECG.   Date: 01/26/2016  EKG Time: 953  Rate: 60  Rhythm: normal sinus rhythm  Axis: Normal  Intervals:none  ST&T Change: No ST segment elevation or depression. T-wave inversion in lead V2 which is possibly secondary to lead placement. Also inverted in V3. EKG from 2014 with similar T wave morphologies. ____________________________________________  RADIOLOGY  IMPRESSION: 1. 2 mm distal right ureteral calculus resulting in mild right hydroureteronephrosis. 2. Multiple hepatic cysts enlarged compared with 03/08/2005.   Electronically Signed By: Elige Ko On: 01/26/2016 14:47       ____________________________________________   PROCEDURES   ____________________________________________   INITIAL IMPRESSION / ASSESSMENT AND PLAN / ED COURSE  Pertinent labs & imaging results that were available during my care of the patient were reviewed by me and considered in my medical decision making (see chart for details).  ----------------------------------------- 3:55 PM on 01/26/2016 -----------------------------------------  Patient resting comfortably but with intermittent agitation. I discussed with his daughter and she says that he son Jeral Pinch. We'll give a small dose of Ativan. We'll be admitting the patient to the hospital for acute renal failure with his nausea and vomiting. He was also found to have a 2 mm distal right stone. I discussed the case with Dr. Ronne Binning of urology who says that this is likely incidental finding. No intervention recommended at this time. He  said that he reviewed the CAT scan. To be admitted to the medicine service. ____________________________________________   FINAL CLINICAL IMPRESSION(S) / ED DIAGNOSES  Abdominal pain. Nausea and vomiting. Acute renal failure. Kidney stone.    Myrna Blazer, MD 01/26/16 1555  Signed out to Dr. Imogene Burn.  Myrna Blazer, MD 01/26/16 [phone]

## 2016-01-27 LAB — BASIC METABOLIC PANEL
Anion gap: 6 (ref 5–15)
BUN: 21 mg/dL — AB (ref 6–20)
CALCIUM: 8.2 mg/dL — AB (ref 8.9–10.3)
CHLORIDE: 109 mmol/L (ref 101–111)
CO2: 23 mmol/L (ref 22–32)
CREATININE: 1.38 mg/dL — AB (ref 0.61–1.24)
GFR calc non Af Amer: 44 mL/min — ABNORMAL LOW (ref >60)
GFR, EST AFRICAN AMERICAN: 51 mL/min — AB (ref >60)
GLUCOSE: 96 mg/dL (ref 65–99)
Potassium: 3.7 mmol/L (ref 3.5–5.1)
Sodium: 138 mmol/L (ref 135–145)

## 2016-01-27 LAB — CBC
HCT: 35.3 % — ABNORMAL LOW (ref 40.0–52.0)
Hemoglobin: 12.8 g/dL — ABNORMAL LOW (ref 13.0–18.0)
MCH: 31.5 pg (ref 26.0–34.0)
MCHC: 36.2 g/dL — AB (ref 32.0–36.0)
MCV: 87.2 fL (ref 80.0–100.0)
PLATELETS: 118 10*3/uL — AB (ref 150–440)
RBC: 4.05 MIL/uL — ABNORMAL LOW (ref 4.40–5.90)
RDW: 13.1 % (ref 11.5–14.5)
WBC: 4.4 10*3/uL (ref 3.8–10.6)

## 2016-01-27 MED ORDER — TAMSULOSIN HCL 0.4 MG PO CAPS
0.4000 mg | ORAL_CAPSULE | Freq: Every day | ORAL | Status: DC
Start: 2016-01-27 — End: 2016-01-28
  Filled 2016-01-27: qty 1

## 2016-01-27 MED ORDER — ENSURE ENLIVE PO LIQD
237.0000 mL | Freq: Two times a day (BID) | ORAL | Status: DC
  Administered 2016-01-27 – 2016-01-28 (×2): 237 mL via ORAL

## 2016-01-27 NOTE — Progress Notes (Signed)
Initial Nutrition Assessment   INTERVENTION:   Meals and Snacks: Cater to patient preferences; will recommend Dysphagia III diet order secondary to poor dentition Coordination of Care: if pt at risk for aspiration, recommend SLP evaulation. Medical Food Supplement Therapy: will recommend Ensure Enlive po BID, each supplement provides 350 kcal and 20 grams of protein   NUTRITION DIAGNOSIS:   Inadequate oral intake related to vomiting as evidenced by per patient/family report.  GOAL:   Patient will meet greater than or equal to 90% of their needs  MONITOR:    (Energy Intake, Electrolyte and renal Profile, Anthropometrics, Digestive system)  REASON FOR ASSESSMENT:   Diagnosis    ASSESSMENT:   Pt admitted with abdominal pain and vomitting with acute renal failure. Pt with h/o dementia currently with sitter. Per MD note, pt without hearing aids currently. Pt remained with bed linens over head during RD interview.  Past Medical History  Diagnosis Date  . Hypertension     Diet Order:  DIET DYS 3 Room service appropriate?: Yes; Fluid consistency:: Thin    Current Nutrition: Pt ate 100% of grits and eggs with 100% of coffee,juice, but no yogurt and bites of applesauce per Liborio Nixon, CNA sitter at bedside. CNA reports per Nsg report pt edentulous.  Food/Nutrition-Related History: Recorded po intake since admission 60-75% of meals. Unable to clarify po intake PTA however RD notes pt with 2 days vomitting PTA.   Scheduled Medications:  . aspirin EC  81 mg Oral Daily  . feeding supplement (ENSURE ENLIVE)  237 mL Oral BID WC  . heparin  5,000 Units Subcutaneous 3 times per day  . Influenza vac split quadrivalent PF  0.5 mL Intramuscular Tomorrow-1000  . pneumococcal 23 valent vaccine  0.5 mL Intramuscular Tomorrow-1000    Continuous Medications:  . sodium chloride 75 mL/hr at 01/27/16 0626     Electrolyte/Renal Profile and Glucose Profile:   Recent Labs Lab 01/26/16 0946  01/27/16 0500  NA 139 138  K 4.3 3.7  CL 106 109  CO2 26 23  BUN 24* 21*  CREATININE 2.28* 1.38*  CALCIUM 9.3 8.2*  GLUCOSE 113* 96   Protein Profile:   Recent Labs Lab 01/26/16 0946  ALBUMIN 4.0    Gastrointestinal Profile: Last BM: unknown   Nutrition-Focused Physical Exam Findings:  Unable to complete Nutrition-Focused physical exam at this time.    Weight Change: RD notes pt weight of 130lbs on admission. RD measured weight on visit at 115lbs. On visit. Unable to clarify further.   Height:   Ht Readings from Last 1 Encounters:  01/27/16  (1.702 m)    Weight:   Wt Readings from Last 1 Encounters:  01/27/16 115 lb 1.6 oz (52.209 kg)    Ideal Body Weight:   67kg  BMI:  Body mass index is 18.02 kg/(m^2).  Estimated Nutritional Needs:   Kcal:  using IBW of 67kg, BEE: 1298kcals, TEE: (IF 1.1-1.3)(AF 1.2) 1714-2059kcals  Protein:  67-80g protein (1.0-1.2g/kg)  Fluid:  1675-2057mL of fluid (25-70mL/kg)  EDUCATION NEEDS:   No education needs identified at this time  MODERATE Care Level  Leda Quail, RD, LDN Pager [phone] Weekend/On-Call Pager [phone]

## 2016-01-27 NOTE — Progress Notes (Signed)
[facility] - [place] at [facility]   PATIENT NAME: Isaiah Mckenzie    MR#:  [id number]  DATE OF BIRTH:  Jan 21, 1927  SUBJECTIVE:   Patient here due to nausea vomiting and noted to be in acute renal failure. Pt. Has baseline dementia and non-verbal at baseline.    REVIEW OF SYSTEMS:    Review of Systems  Unable to perform ROS: dementia    Nutrition: Dysphagia III Tolerating Diet: Yes Tolerating PT: Await Eval.    DRUG ALLERGIES:  No Known Allergies  VITALS:  Blood pressure 138/65, pulse 86, temperature 97.9 F (36.6 C), temperature source Oral, resp. rate 18, height  (1.702 m), weight 52.209 kg (115 lb 1.6 oz), SpO2 97 %.  PHYSICAL EXAMINATION:   Physical Exam  GENERAL:  82 y.o.-year-old patient lying in the bed with no acute distress.  EYES: Pupils equal, round, reactive to light and accommodation. No scleral icterus. Extraocular muscles intact.  HEENT: Head atraumatic, normocephalic. Oropharynx and nasopharynx clear.  NECK:  Supple, no jugular venous distention. No thyroid enlargement, no tenderness.  LUNGS: Normal breath sounds bilaterally, no wheezing, rales, rhonchi. No use of accessory muscles of respiration.  CARDIOVASCULAR: S1, S2 normal. No murmurs, rubs, or gallops.  ABDOMEN: Soft, nontender, nondistended. Bowel sounds present. No organomegaly or mass.  EXTREMITIES: No cyanosis, clubbing or edema b/l.    NEUROLOGIC: Cranial nerves II through XII are intact. No focal Motor or sensory deficits b/l. Globally weak.   PSYCHIATRIC: The patient is alert and oriented x 1. Demented.  SKIN: No obvious rash, lesion, or ulcer.    LABORATORY PANEL:   CBC  Recent Labs Lab 01/27/16 0500  WBC 4.4  HGB 12.8*  HCT 35.3*  PLT 118*   ------------------------------------------------------------------------------------------------------------------  Chemistries   Recent Labs Lab 01/26/16 0946 01/27/16 0500  NA 139 138  K 4.3 3.7  CL  106 109  CO2 26 23  GLUCOSE 113* 96  BUN 24* 21*  CREATININE 2.28* 1.38*  CALCIUM 9.3 8.2*  AST 28  --   ALT 10*  --   ALKPHOS 102  --   BILITOT 1.8*  --    ------------------------------------------------------------------------------------------------------------------  Cardiac Enzymes  Recent Labs Lab 01/26/16 0946  TROPONINI <0.03   ------------------------------------------------------------------------------------------------------------------  RADIOLOGY:  Ct Abdomen Pelvis Wo Contrast  01/26/2016  CLINICAL DATA:  Two days of vomiting and decreased appetite. Diffuse abdominal pain. EXAM: CT ABDOMEN AND PELVIS WITHOUT CONTRAST TECHNIQUE: Multidetector CT imaging of the abdomen and pelvis was performed following the standard protocol without IV contrast. COMPARISON:  03/08/2005 FINDINGS: Lower chest:  Lung bases are clear.  Normal heart size. Hepatobiliary: Multiple hypodense, fluid attenuating hepatic masses which appear enlarged compared with the prior exam of 03/08/2005, but likely represent cysts. Normal gallbladder. Pancreas: Normal. Spleen: Normal. Adrenals/Urinary Tract: Normal adrenal glands. 1.5 cm hypodense, fluid attenuating left renal mass most consistent with a cyst. Mild right hydroureteronephrosis. 2 mm distal right ureteral calculus. Prostatic enlargement impressing upon the bladder base. Normal bladder. Stomach/Bowel: No bowel dilatation or bowel wall thickening. No pneumatosis, pneumoperitoneum or portal venous gas. Diverticulosis without evidence of diverticulitis. Prior right hernia repair. Vascular/Lymphatic: Normal caliber abdominal aorta with atherosclerosis. No lymphadenopathy. Other: No fluid collection or hematoma. Musculoskeletal: No lytic or sclerotic osseous lesion. No acute osseous abnormality. Degenerative disc disease with disc height loss at L5-S1 with bilateral facet arthropathy. IMPRESSION: 1. 2 mm distal right ureteral calculus resulting in mild right  hydroureteronephrosis. 2. Multiple hepatic cysts enlarged compared with 03/08/2005.  Electronically Signed   By: Elige Ko   On: 01/26/2016 14:47     ASSESSMENT AND PLAN:   82 yo male with hx of dementia, essential HTN who presented to the hospital w/ N/V and noted to in Acute Renal Failure.   1. Acute Renal Failure - likely due to nephrolithiasis and also N/V.  - cont. IV fluids and follow BUN/Cr which has improved.  - CT scan showing 2 mm ureteral calculus and mild hydronephrosis.  - hold ACE for now.   2. Essential HTN - currently normotensive.  - hold LIsinopril for now.   3. Nephrolithiasis-asymptomatic. -Continue IV fluids. As per urology and no need for acute intervention. -Outpatient follow-up. - will start flomax.   4. Thrombocytopenia - will monitor.  - no acute bleeding.    All the records are reviewed and case discussed with Care Management/Social Workerr. Management plans discussed with the patient, family and they are in agreement.  CODE STATUS: Full  DVT Prophylaxis: Heparin SQ  TOTAL TIME TAKING CARE OF THIS PATIENT: 25 minutes.   POSSIBLE D/C IN 1-2 DAYS, DEPENDING ON CLINICAL CONDITION.   Houston Siren M.D on 01/27/2016 at 4:07 PM  Between 7am to 6pm - Pager - [phone]  After 6pm go to [email or web address] - password [email or web address] [facility]  [place] [facility]  Office  [phone]  CC: Primary care physician; No primary care provider on file.

## 2016-01-27 NOTE — Evaluation (Signed)
Physical Therapy Evaluation Patient Details Name: Joden Bonsall MRN: [id number] DOB: January 15, 1927 Today's Date: 01/27/2016   History of Present Illness  presented to ER secondary to nausea/vomiting x3 days; admitted with acute renal failure related to R hydroureteronephrosis.  Clinical Impression  Patient sleeping soundly upon arrival, but arousable and agreeable to session with mod encouragement from therapist.  Pleasantly confused, but follows simple commands (with use of gestures and hand-over-hand); extremely HOH.  Bilat UE/LE strength and ROM grossly WFL and symmetrical, at least 4/5; no clinical indicators of pain noted.  Currently requiring min assist for bed mobility, sit/stand, basic transfers and gait (80') with RW; slightly festinating at times with noted balance deficits throughout gait trial (esp with turns, obstacle negotiation, surface changes).  Patient generally unaware of deficits and demonstrates limited ability to comprehend/process cuing from therapist.  Do recommend continued use of RW and +1 assist at all times. Would benefit from skilled PT to address above deficits and promote optimal return to PLOF; Recommend transition to HHPT with 24 hour sup/assist upon discharge from acute hospitalization.     Follow Up Recommendations Home health PT;Supervision/Assistance - 24 hour    Equipment Recommendations  Rolling walker with 5" wheels    Recommendations for Other Services       Precautions / Restrictions Precautions Precautions: Fall Restrictions Weight Bearing Restrictions: No      Mobility  Bed Mobility Overal bed mobility: Needs Assistance Bed Mobility: Supine to Sit;Sit to Supine     Supine to sit: Min assist Sit to supine: Supervision      Transfers Overall transfer level: Needs assistance Equipment used: Rolling walker (2 wheeled) Transfers: Sit to/from Stand Sit to Stand: Min assist            Ambulation/Gait Ambulation/Gait  assistance: Min assist Ambulation Distance (Feet): 80 Feet Assistive device: Rolling walker (2 wheeled)       General Gait Details: step to gait pattern with short, shuffling steps; maintains L LE in slight ER throughout gait cycle.  Slightly festinating at times-difficulty negotiating turns, stationary obstacles and surface changes. Unsafe to complete without RW and +1 at all times.  Stairs            Wheelchair Mobility    Modified Rankin (Stroke Patients Only)       Balance Overall balance assessment: Needs assistance Sitting-balance support: No upper extremity supported;Feet supported Sitting balance-Leahy Scale: Good     Standing balance support: Bilateral upper extremity supported Standing balance-Leahy Scale: Fair                               Pertinent Vitals/Pain Pain Assessment: Faces Faces Pain Scale: No hurt Pain Location: no clinical indicators of pain Pain Descriptors / Indicators: Aching Pain Intervention(s): Limited activity within patient's tolerance;Monitored during session;Repositioned    Home Living Family/patient expects to be discharged to:: Private residence Living Arrangements: Children Available Help at Discharge: Family           Home Equipment: Gilmer Mor - single point;Cane - quad Additional Comments: Information obtained from sitter at beside (received info from family member earlier in the day); patient unable to provide.    Prior Function Level of Independence: Needs assistance         Comments: Patient unable to provide.  Per sitter, patient occasionally uses SPC, refuses use of RW (and walking aid all together sometimes) within the home environment     Hand Dominance  Extremity/Trunk Assessment   Upper Extremity Assessment:  (difficult to formally assess due to [facility] and impaired cognition, grossly at least 4-/5 throughout)           Lower Extremity Assessment:  (difficult to formally assess due to  [facility] and impaired cognition, grossly at least 4-/5 throughout)         Communication   Communication: HOH  Cognition Arousal/Alertness: Awake/alert Behavior During Therapy: WFL for tasks assessed/performed Overall Cognitive Status: History of cognitive impairments - at baseline                      General Comments      Exercises        Assessment/Plan    PT Assessment Patient needs continued PT services  PT Diagnosis Difficulty walking;Generalized weakness   PT Problem List Decreased activity tolerance;Decreased balance;Decreased mobility;Decreased safety awareness  PT Treatment Interventions DME instruction;Gait training;Stair training;Functional mobility training;Therapeutic activities;Therapeutic exercise;Balance training;Patient/family education   PT Goals (Current goals can be found in the Care Plan section) Acute Rehab PT Goals PT Goal Formulation: Patient unable to participate in goal setting Time For Goal Achievement: 02/10/16 Potential to Achieve Goals: Fair    Frequency Min 2X/week   Barriers to discharge        Co-evaluation               End of Session Equipment Utilized During Treatment: Gait belt Activity Tolerance: Patient tolerated treatment well Patient left: in bed;with call bell/phone within reach;with bed alarm set;with nursing/sitter in room Nurse Communication: Mobility status         Time: 5409-8119 PT Time Calculation (min) (ACUTE ONLY): 15 min   Charges:   PT Evaluation $PT Eval Low Complexity: 1 Procedure     PT G Codes:       Jamiah Recore H. Manson Passey, PT, DPT, NCS 01/27/2016, 4:18 PM [phone]

## 2016-01-28 LAB — BASIC METABOLIC PANEL
Anion gap: 4 — ABNORMAL LOW (ref 5–15)
BUN: 15 mg/dL (ref 6–20)
CHLORIDE: 111 mmol/L (ref 101–111)
CO2: 24 mmol/L (ref 22–32)
CREATININE: 1.16 mg/dL (ref 0.61–1.24)
Calcium: 8.8 mg/dL — ABNORMAL LOW (ref 8.9–10.3)
GFR calc Af Amer: >60 mL/min (ref >60)
GFR calc non Af Amer: 55 mL/min — ABNORMAL LOW (ref >60)
Glucose, Bld: 112 mg/dL — ABNORMAL HIGH (ref 65–99)
Potassium: 3.9 mmol/L (ref 3.5–5.1)
SODIUM: 139 mmol/L (ref 135–145)

## 2016-01-28 MED ORDER — TAMSULOSIN HCL 0.4 MG PO CAPS: 0.4000 mg | ORAL_CAPSULE | Freq: Every day | ORAL | Status: DC

## 2016-01-28 NOTE — Progress Notes (Signed)
01/28/2016  5:07 PM  Merryl Hacker to be D/C'd Home per MD order.  Discussed prescriptions and follow up appointments with the patient. Prescriptions given to patient, medication list explained in detail. Pt verbalized understanding.    Medication List    TAKE these medications        aspirin EC 81 MG tablet  Take 1 tablet by mouth daily.     lisinopril 10 MG tablet  Commonly known as:  PRINIVIL,ZESTRIL  Take 1 tablet by mouth daily.     tamsulosin 0.4 MG Caps capsule  Commonly known as:  FLOMAX  Take 1 capsule (0.4 mg total) by mouth daily.        Filed Vitals:   01/28/16 0520 01/28/16 0857  BP: 134/72 144/50  Pulse: 51 59  Temp: 97.8 F (36.6 C) 97.5 F (36.4 C)  Resp: 20 20    Skin clean, dry and intact without evidence of skin break down, no evidence of skin tears noted. IV catheter discontinued intact. Site without signs and symptoms of complications. Dressing and pressure applied. Pt denies pain at this time. No complaints noted.  An After Visit Summary was printed and given to the patient. Patient escorted via WC, and D/C home via private auto.  Bradly Chris

## 2016-01-28 NOTE — Discharge Summary (Signed)
[facility] - [place] at [facility]   PATIENT NAME: Isaiah Mckenzie    MR#:  [id number]  DATE OF BIRTH:  1927/01/18  DATE OF ADMISSION:  01/26/2016 ADMITTING PHYSICIAN: Shaune Pollack, MD  DATE OF DISCHARGE: 01/28/2016  5:10 PM  PRIMARY CARE PHYSICIAN: FELDPAUSCH, DALE E, MD    ADMISSION DIAGNOSIS:  Kidney stone [N20.0] Generalized abdominal pain [R10.84] Acute renal failure, unspecified acute renal failure type (HCC) [N17.9] Non-intractable vomiting with nausea, vomiting of unspecified type [R11.2]  DISCHARGE DIAGNOSIS:  Principal Problem:   ARF (acute renal failure) (HCC)   SECONDARY DIAGNOSIS:   Past Medical History  Diagnosis Date  . Hypertension     HOSPITAL COURSE:   82 yo male with hx of dementia, essential HTN who presented to the hospital w/ N/V and noted to in Acute Renal Failure.   1. Acute Renal Failure - this was due to nephrolithiasis and also N/V. Pt. Was admitted to the hospital and hydrated with IV fluids.  On admission the ER physician had spoken to a Urologist about the CT SCan findings of right sided nephrolithiasis and mild hydronephrosis but they did not recommend any acute intervention.  They recommended supportive care.   - after IV fluid hydration his BUN/Cr has improved and normalized.  He has no abdominal pain or N/V. He was started on Flomax which he will cont. And will be referred to Urology as outpatient.  - his ACE was held but now can be resumed upon discharge.    2. Essential HTN - due to ARF his ACE was held but now can be resumed upon discharge as his renal function is back to baseline.   3. Nephrolithiasis- he was clinically asymptomatic while in the hospital.  - he was given IV fluids and started on flomax.  He will be discharged on Flomax with Urology follow up as outpatient.   4. Thrombocytopenia - incidentally noted and he had no acute bleeding and counts can be further followed as outpatient.   DISCHARGE  CONDITIONS:   Stable  CONSULTS OBTAINED:     DRUG ALLERGIES:  No Known Allergies  DISCHARGE MEDICATIONS:   Discharge Medication List as of 01/28/2016  4:09 PM    START taking these medications   Details  tamsulosin (FLOMAX) 0.4 MG CAPS capsule Take 1 capsule (0.4 mg total) by mouth daily., Starting 01/28/2016, Until Discontinued, Print      CONTINUE these medications which have NOT CHANGED   Details  aspirin EC 81 MG tablet Take 1 tablet by mouth daily., Until Discontinued, Historical Med    lisinopril (PRINIVIL,ZESTRIL) 10 MG tablet Take 1 tablet by mouth daily., Starting 11/24/2015, Until Wed 11/23/16, Historical Med         DISCHARGE INSTRUCTIONS:   DIET:  Cardiac diet  DISCHARGE CONDITION:  Stable  ACTIVITY:  Activity as tolerated  OXYGEN:  Home Oxygen: No.   Oxygen Delivery: room air  DISCHARGE LOCATION:  home with home health nursing, pT, home health aide.   If you experience worsening of your admission symptoms, develop shortness of breath, life threatening emergency, suicidal or homicidal thoughts you must seek medical attention immediately by calling 911 or calling your MD immediately  if symptoms less severe.  You Must read complete instructions/literature along with all the possible adverse reactions/side effects for all the Medicines you take and that have been prescribed to you. Take any new Medicines after you have completely understood and accpet all the possible adverse reactions/side effects.  Please note  You were cared for by a hospitalist during your hospital stay. If you have any questions about your discharge medications or the care you received while you were in the hospital after you are discharged, you can call the unit and asked to speak with the hospitalist on call if the hospitalist that took care of you is not available. Once you are discharged, your primary care physician will handle any further medical issues. Please note that NO  REFILLS for any discharge medications will be authorized once you are discharged, as it is imperative that you return to your primary care physician (or establish a relationship with a primary care physician if you do not have one) for your aftercare needs so that they can reassess your need for medications and monitor your lab values.     Today   No abdominal pain, N/V. Tolerating PO well.  Family at bedside.   VITAL SIGNS:  Blood pressure 144/50, pulse 59, temperature 97.5 F (36.4 C), temperature source Oral, resp. rate 20, height  (1.702 m), weight 49.624 kg (109 lb 6.4 oz), SpO2 100 %.  I/O:   Intake/Output Summary (Last 24 hours) at 01/28/16 2107 Last data filed at 01/28/16 1520  Gross per 24 hour  Intake 1457.25 ml  Output   2395 ml  Net -937.75 ml    PHYSICAL EXAMINATION:  GENERAL:  82 y.o.-year-old demented patient lying in the bed with no acute distress.  EYES: Pupils equal, round, reactive to light and accommodation. No scleral icterus. Extraocular muscles intact.  HEENT: Head atraumatic, normocephalic. Oropharynx and nasopharynx clear.  NECK:  Supple, no jugular venous distention. No thyroid enlargement, no tenderness.  LUNGS: Normal breath sounds bilaterally, no wheezing, rales,rhonchi. No use of accessory muscles of respiration.  CARDIOVASCULAR: S1, S2 normal. No murmurs, rubs, or gallops.  ABDOMEN: Soft, non-tender, non-distended. Bowel sounds present. No organomegaly or mass.  EXTREMITIES: No pedal edema, cyanosis, or clubbing.  NEUROLOGIC: Cranial nerves II through XII are intact. No focal motor or sensory defecits b/l. PSYCHIATRIC: The patient is alert and oriented x 1.  SKIN: No obvious rash, lesion, or ulcer.   DATA REVIEW:   CBC  Recent Labs Lab 01/27/16 0500  WBC 4.4  HGB 12.8*  HCT 35.3*  PLT 118*    Chemistries   Recent Labs Lab 01/26/16 0946  01/28/16 0909  NA 139  < > 139  K 4.3  < > 3.9  CL 106  < > 111  CO2 26  < > 24   GLUCOSE 113*  < > 112*  BUN 24*  < > 15  CREATININE 2.28*  < > 1.16  CALCIUM 9.3  < > 8.8*  AST 28  --   --   ALT 10*  --   --   ALKPHOS 102  --   --   BILITOT 1.8*  --   --   < > = values in this interval not displayed.  Cardiac Enzymes  Recent Labs Lab 01/26/16 0946  TROPONINI <0.03    Microbiology Results  No results found for this or any previous visit.  RADIOLOGY:  No results found.    Management plans discussed with the patient, family and they are in agreement.  CODE STATUS:  Code Status History    Date Active Date Inactive Code Status Order ID Comments User Context   01/26/2016  6:00 PM 01/28/2016  8:11 PM Full Code [id number]  Shaune Pollack, MD Inpatient  TOTAL TIME TAKING CARE OF THIS PATIENT: 40 minutes.    Houston Siren M.D on 01/28/2016 at 9:07 PM  Between 7am to 6pm - Pager - [phone]  After 6pm go to [email or web address] - password [email or web address] [facility]  [place] [facility]  Office  [phone]  CC: Primary care physician; [facility], Madaline Guthrie, MD

## 2016-01-28 NOTE — Care Management Important Message (Signed)
Important Message  Patient Details  Name: Isaiah Mckenzie MRN: [id number] Date of Birth: 07/21/1927   Medicare Important Message Given:  Yes    Olegario Messier A Tam Savoia 01/28/2016, 10:34 AM

## 2016-01-28 NOTE — Care Management Note (Signed)
Case Management Note  Patient Details  Name: Taivon Haroon MRN: [id number] Date of Birth: [birth year]  Subjective/Objective:                 Patient admitted from home with ARF.  Patient lives at home with his son who is there 24/7.  Patient's daughter is at bedside.  She states that she provides her father with transportation,and will be picking him up at time of discharge.  Patient obtains his medications at [facility] on [address]PT has recommend home health PT.  Daughter was provided list of agencies, and patient has history of dementia and confusion.  No preference.  Referral made to [facility].  Barbara Cower with [facility] notified.  Order placed for rolling walker.  Will with [facility] notified and walker will be delivered to home.     Action/Plan: RNCM signing off  Expected Discharge Date:                  Expected Discharge Plan:     In-House Referral:     Discharge planning Services     Post Acute Care Choice:    Choice offered to:     DME Arranged:    DME Agency:     HH Arranged:    HH Agency:     Status of Service:     Medicare Important Message Given:  Yes Date Medicare IM Given:    Medicare IM give by:    Date Additional Medicare IM Given:    Additional Medicare Important Message give by:     If discussed at Long Length of Stay Meetings, dates discussed:    Additional Comments:  Chapman Fitch, RN 01/28/2016, 1:53 PM

## 2016-02-03 NOTE — Progress Notes (Signed)
   01/27/16 1615  PT Time Calculation  PT Start Time (ACUTE ONLY) 1501  PT Stop Time (ACUTE ONLY) 1516  PT Time Calculation (min) (ACUTE ONLY) 15 min  PT G-Codes **NOT FOR INPATIENT CLASS**  Functional Assessment Tool Used clinical judgement  Functional Limitation Mobility: Walking and moving around  Mobility: Walking and Moving Around Current Status (Z6109(G8978) CJ  Mobility: Walking and Moving Around Goal Status (U0454(G8979) CI  PT General Charges  $$ ACUTE PT VISIT 1 Procedure  PT Evaluation  $PT Eval Low Complexity 1 Procedure   Late-entry G-code entered based on review of initial documentation.  Kloe Oates H. Manson PasseyBrown, PT, DPT, NCS 02/03/2016, 2:10 PM [phone]

## 2016-02-08 ENCOUNTER — Ambulatory Visit: Payer: Self-pay | Admitting: Urology

## 2016-02-19 ENCOUNTER — Encounter: Payer: Self-pay | Admitting: Urology

## 2016-02-19 ENCOUNTER — Ambulatory Visit (INDEPENDENT_AMBULATORY_CARE_PROVIDER_SITE_OTHER): Payer: Medicare Other | Admitting: Urology

## 2016-02-19 VITALS — BP 117/71 | HR 89 | Ht 68.0 in | Wt 112.7 lb

## 2016-02-19 DIAGNOSIS — N200 Calculus of kidney: Secondary | ICD-10-CM | POA: Diagnosis not present

## 2016-02-19 LAB — BLADDER SCAN AMB NON-IMAGING: SCAN RESULT: 70

## 2016-02-19 NOTE — Addendum Note (Signed)
Addended by: Mervin KungWILLIAMS, RAMONA K on: 02/19/2016 03:48 PM   Modules accepted: Orders

## 2016-02-19 NOTE — Progress Notes (Signed)
02/19/2016 3:16 PM   Isaiah Mckenzie December 17, 1926 [id number]  Referring provider: Marina Goodell, MD [address] [facility] [place], [place] [address]  Chief Complaint  Patient presents with  . Nephrolithiasis    hospital follow up   . Acute Renal Failure    HPI: The patient is an 82 year old gentleman who is Ms. the hospital recently with right flank pain and nausea and vomiting. He was diagnosed with AKI as well as a 2 mm right distal ureteral calculus with mild right hydroureteronephrosis proximal to the stone. He was admitted on hospital with IV fluid hydration and his creatinine normalized. It is currently 1.3 down from 2.3 at admission. The patient is a poor historian and is unable to provide any history at this time. According to his daughter, the time of admission he was complaining of right flank pain since resolved. He's never had kidney stones with her knowledge before. He has never seen a urologist before. The patient does not complain of current right flank pain   PMH: Past Medical History  Diagnosis Date  . Hypertension   . Kidney stone   . Memory loss   . Hyperthyroidism     Surgical History: Past Surgical History  Procedure Laterality Date  . Eye surgery  2014  . Hernia repair  2014    Home Medications:    Medication List       This list is accurate as of: 02/19/16  3:16 PM.  Always use your most recent med list.               aspirin EC 81 MG tablet  Take 1 tablet by mouth daily. Reported on 02/19/2016     lisinopril 10 MG tablet  Commonly known as:  PRINIVIL,ZESTRIL  Take 1 tablet by mouth daily.     tamsulosin 0.4 MG Caps capsule  Commonly known as:  FLOMAX  Take 1 capsule (0.4 mg total) by mouth daily.        Allergies: No Known Allergies  Family History: Family History  Problem Relation Age of Onset  . Kidney disease Neg Hx   . Prostate cancer Neg Hx   . Breast cancer Sister   . Migraines  Maternal Grandmother     2  . Heart attack Father   . Pneumonia Mother     Social History:  reports that he has never smoked. He uses smokeless tobacco. He reports that he does not drink alcohol or use illicit drugs.  ROS: UROLOGY Frequent Urination?: Yes Hard to postpone urination?: No Burning/pain with urination?: No Get up at night to urinate?: Yes Leakage of urine?: Yes Urine stream starts and stops?: Yes Trouble starting stream?: Yes Do you have to strain to urinate?: No Blood in urine?: No Urinary tract infection?: No Sexually transmitted disease?: No Injury to kidneys or bladder?: No Painful intercourse?: No Weak stream?: No Erection problems?: No Penile pain?: No  Gastrointestinal Nausea?: No Vomiting?: No Indigestion/heartburn?: No Diarrhea?: No Constipation?: No  Constitutional Fever: No Night sweats?: No Weight loss?: Yes Fatigue?: Yes  Skin Skin rash/lesions?: No Itching?: No  Eyes Blurred vision?: No Double vision?: No  Ears/Nose/Throat Sore throat?: No Sinus problems?: No  Hematologic/Lymphatic Swollen glands?: No Easy bruising?: Yes  Cardiovascular Leg swelling?: No Chest pain?: No  Respiratory Cough?: Yes Shortness of breath?: Yes  Endocrine Excessive thirst?: Yes  Musculoskeletal Back pain?: No Joint pain?: No  Neurological Headaches?: No Dizziness?: No  Psychologic Depression?:  No Anxiety?: No  Physical Exam: BP 117/71 mmHg  Pulse 89  Ht 5\' 8"  (1.727 m)  Wt 112 lb 11.2 oz (51.12 kg)  BMI 17.14 kg/m2  Constitutional:  Alert and oriented, No acute distress. HEENT: [place] AT, moist mucus membranes.  Trachea midline, no masses. Cardiovascular: No clubbing, cyanosis, or edema. Respiratory: Normal respiratory effort, no increased work of breathing. GI: Abdomen is soft, nontender, nondistended, no abdominal masses GU: No CVA tenderness.  Skin: No rashes, bruises or suspicious lesions. Lymph: No cervical or inguinal  adenopathy. Neurologic: Grossly intact, no focal deficits, moving all 4 extremities. Psychiatric: Normal mood and affect.  Laboratory Data: Lab Results  Component Value Date   WBC 4.4 01/27/2016   HGB 12.8* 01/27/2016   HCT 35.3* 01/27/2016   MCV 87.2 01/27/2016   PLT 118* 01/27/2016    Lab Results  Component Value Date   CREATININE 1.16 01/28/2016    No results found for: PSA  No results found for: TESTOSTERONE  No results found for: HGBA1C  Urinalysis    Component Value Date/Time   COLORURINE YELLOW* 01/26/2016 0946   COLORURINE Yellow 09/30/2014 2352   APPEARANCEUR CLEAR* 01/26/2016 0946   APPEARANCEUR Clear 09/30/2014 2352   LABSPEC 1.014 01/26/2016 0946   LABSPEC 1.014 09/30/2014 2352   PHURINE 6.0 01/26/2016 0946   PHURINE 6.0 09/30/2014 2352   GLUCOSEU NEGATIVE 01/26/2016 0946   GLUCOSEU Negative 09/30/2014 2352   HGBUR 2+* 01/26/2016 0946   HGBUR Negative 09/30/2014 2352   BILIRUBINUR NEGATIVE 01/26/2016 0946   BILIRUBINUR Negative 09/30/2014 2352   KETONESUR TRACE* 01/26/2016 0946   KETONESUR Negative 09/30/2014 2352   PROTEINUR NEGATIVE 01/26/2016 0946   PROTEINUR Negative 09/30/2014 2352   NITRITE NEGATIVE 01/26/2016 0946   NITRITE Negative 09/30/2014 2352   LEUKOCYTESUR NEGATIVE 01/26/2016 0946   LEUKOCYTESUR Negative 09/30/2014 2352    Pertinent Imaging:  CLINICAL DATA: Two days of vomiting and decreased appetite. Diffuse abdominal pain.  EXAM: CT ABDOMEN AND PELVIS WITHOUT CONTRAST  TECHNIQUE: Multidetector CT imaging of the abdomen and pelvis was performed following the standard protocol without IV contrast.  COMPARISON: 03/08/2005  FINDINGS: Lower chest: Lung bases are clear. Normal heart size.  Hepatobiliary: Multiple hypodense, fluid attenuating hepatic masses which appear enlarged compared with the prior exam of 03/08/2005, but likely represent cysts. Normal gallbladder.  Pancreas: Normal.  Spleen:  Normal.  Adrenals/Urinary Tract: Normal adrenal glands. 1.5 cm hypodense, fluid attenuating left renal mass most consistent with a cyst. Mild right hydroureteronephrosis. 2 mm distal right ureteral calculus. Prostatic enlargement impressing upon the bladder base. Normal bladder.  Stomach/Bowel: No bowel dilatation or bowel wall thickening. No pneumatosis, pneumoperitoneum or portal venous gas. Diverticulosis without evidence of diverticulitis. Prior right hernia repair.  Vascular/Lymphatic: Normal caliber abdominal aorta with atherosclerosis. No lymphadenopathy.  Other: No fluid collection or hematoma.  Musculoskeletal: No lytic or sclerotic osseous lesion. No acute osseous abnormality. Degenerative disc disease with disc height loss at L5-S1 with bilateral facet arthropathy.  IMPRESSION: 1. 2 mm distal right ureteral calculus resulting in mild right hydroureteronephrosis. 2. Multiple hepatic cysts enlarged compared with 03/08/2005.     Assessment & Plan:    1. 2 mm distal right ureteral calculus with mild right hydronephrosis The patient had a 2 mm right distal calculus with subsequent hydroureteronephrosis. The stone has likely passed given the resolution of his symptoms as well as his AKA. The stone is too small see on a KUB x-ray, so we will order a renal ultrasound to ensure  there is resolution of the hydronephrosis and presumptive stone passage. He'll follow-up in a few weeks after the study.   Return in about 2 weeks (around 03/04/2016) for with renal ultrasound prior.  Hildred Laser, MD  [facility] [address] [place], [place] [address] [phone]

## 2016-02-29 ENCOUNTER — Ambulatory Visit: Payer: Medicare Other

## 2016-03-04 ENCOUNTER — Ambulatory Visit: Payer: Medicare Other

## 2017-03-17 ENCOUNTER — Emergency Department: Payer: Medicare Other

## 2017-03-17 ENCOUNTER — Inpatient Hospital Stay
Admission: EM | Admit: 2017-03-17 | Discharge: 2017-03-21 | DRG: 480 | Disposition: A | Discharge status: Skilled Nursing Facility | Payer: Medicare Other | Attending: Specialist | Admitting: Specialist

## 2017-03-17 ENCOUNTER — Encounter: Payer: Self-pay | Admitting: Emergency Medicine

## 2017-03-17 DIAGNOSIS — N400 Enlarged prostate without lower urinary tract symptoms: Secondary | ICD-10-CM | POA: Diagnosis present

## 2017-03-17 DIAGNOSIS — Y92098 Other place in other non-institutional residence as the place of occurrence of the external cause: Secondary | ICD-10-CM

## 2017-03-17 DIAGNOSIS — N183 Chronic kidney disease, stage 3 (moderate): Secondary | ICD-10-CM | POA: Diagnosis present

## 2017-03-17 DIAGNOSIS — F17290 Nicotine dependence, other tobacco product, uncomplicated: Secondary | ICD-10-CM | POA: Diagnosis present

## 2017-03-17 DIAGNOSIS — E0590 Thyrotoxicosis, unspecified without thyrotoxic crisis or storm: Secondary | ICD-10-CM | POA: Diagnosis present

## 2017-03-17 DIAGNOSIS — M25551 Pain in right hip: Secondary | ICD-10-CM | POA: Diagnosis present

## 2017-03-17 DIAGNOSIS — W010XXA Fall on same level from slipping, tripping and stumbling without subsequent striking against object, initial encounter: Secondary | ICD-10-CM | POA: Diagnosis present

## 2017-03-17 DIAGNOSIS — Z7982 Long term (current) use of aspirin: Secondary | ICD-10-CM | POA: Diagnosis not present

## 2017-03-17 DIAGNOSIS — S72141A Displaced intertrochanteric fracture of right femur, initial encounter for closed fracture: Secondary | ICD-10-CM | POA: Diagnosis present

## 2017-03-17 DIAGNOSIS — L89151 Pressure ulcer of sacral region, stage 1: Secondary | ICD-10-CM | POA: Diagnosis present

## 2017-03-17 DIAGNOSIS — T4145XA Adverse effect of unspecified anesthetic, initial encounter: Secondary | ICD-10-CM | POA: Diagnosis not present

## 2017-03-17 DIAGNOSIS — I129 Hypertensive chronic kidney disease with stage 1 through stage 4 chronic kidney disease, or unspecified chronic kidney disease: Secondary | ICD-10-CM | POA: Diagnosis present

## 2017-03-17 DIAGNOSIS — S72001A Fracture of unspecified part of neck of right femur, initial encounter for closed fracture: Secondary | ICD-10-CM

## 2017-03-17 DIAGNOSIS — G92 Toxic encephalopathy: Secondary | ICD-10-CM | POA: Diagnosis not present

## 2017-03-17 DIAGNOSIS — Z8249 Family history of ischemic heart disease and other diseases of the circulatory system: Secondary | ICD-10-CM | POA: Diagnosis not present

## 2017-03-17 DIAGNOSIS — W19XXXA Unspecified fall, initial encounter: Secondary | ICD-10-CM

## 2017-03-17 DIAGNOSIS — F0391 Unspecified dementia with behavioral disturbance: Secondary | ICD-10-CM | POA: Diagnosis present

## 2017-03-17 DIAGNOSIS — L8990 Pressure ulcer of unspecified site, unspecified stage: Secondary | ICD-10-CM | POA: Insufficient documentation

## 2017-03-17 LAB — CBC WITH DIFFERENTIAL/PLATELET
BASOS ABS: 0 10*3/uL (ref 0–0.1)
BASOS PCT: 0 %
EOS ABS: 0.1 10*3/uL (ref 0–0.7)
EOS PCT: 1 %
HEMATOCRIT: 41.9 % (ref 40.0–52.0)
HEMOGLOBIN: 14.7 g/dL (ref 13.0–18.0)
Lymphocytes Relative: 8 %
Lymphs Abs: 0.5 10*3/uL — ABNORMAL LOW (ref 1.0–3.6)
MCH: 31.8 pg (ref 26.0–34.0)
MCHC: 35.1 g/dL (ref 32.0–36.0)
MCV: 90.7 fL (ref 80.0–100.0)
MONO ABS: 0.2 10*3/uL (ref 0.2–1.0)
Monocytes Relative: 4 %
Neutro Abs: 5.5 10*3/uL (ref 1.4–6.5)
Neutrophils Relative %: 87 %
Platelets: 145 10*3/uL — ABNORMAL LOW (ref 150–440)
RBC: 4.62 MIL/uL (ref 4.40–5.90)
RDW: 12.9 % (ref 11.5–14.5)
WBC: 6.3 10*3/uL (ref 3.8–10.6)

## 2017-03-17 LAB — BASIC METABOLIC PANEL
ANION GAP: 4 — AB (ref 5–15)
BUN: 11 mg/dL (ref 6–20)
CALCIUM: 9.2 mg/dL (ref 8.9–10.3)
CHLORIDE: 104 mmol/L (ref 101–111)
CO2: 29 mmol/L (ref 22–32)
Creatinine, Ser: 1.25 mg/dL — ABNORMAL HIGH (ref 0.61–1.24)
GFR calc non Af Amer: 50 mL/min — ABNORMAL LOW (ref >60)
GFR, EST AFRICAN AMERICAN: 57 mL/min — AB (ref >60)
GLUCOSE: 121 mg/dL — AB (ref 65–99)
POTASSIUM: 4 mmol/L (ref 3.5–5.1)
Sodium: 137 mmol/L (ref 135–145)

## 2017-03-17 LAB — SURGICAL PCR SCREEN
MRSA, PCR: NEGATIVE
Staphylococcus aureus: NEGATIVE

## 2017-03-17 LAB — TROPONIN I: Troponin I: <0.03 ng/mL (ref <0.03)

## 2017-03-17 MED ORDER — ORAL CARE MOUTH RINSE: 15.0000 mL | Freq: Two times a day (BID) | OROMUCOSAL | Status: DC

## 2017-03-17 MED ORDER — SODIUM CHLORIDE 0.9 % IV BOLUS (SEPSIS)
250.0000 mL | Freq: Once | INTRAVENOUS | Status: AC
Start: 2017-03-17 — End: 2017-03-17
  Administered 2017-03-17: 250 mL via INTRAVENOUS

## 2017-03-17 MED ORDER — CEFAZOLIN SODIUM-DEXTROSE 2-4 GM/100ML-% IV SOLN
2.0000 g | INTRAVENOUS | Status: DC
  Filled 2017-03-17: qty 100

## 2017-03-17 MED ORDER — HEPARIN SODIUM (PORCINE) 5000 UNIT/ML IJ SOLN: 5000.0000 [IU] | Freq: Three times a day (TID) | INTRAMUSCULAR | Status: DC

## 2017-03-17 MED ORDER — FENTANYL CITRATE (PF) 100 MCG/2ML IJ SOLN: 25.0000 ug | INTRAMUSCULAR | Status: DC | PRN

## 2017-03-17 MED ORDER — HYDROCODONE-ACETAMINOPHEN 5-325 MG PO TABS: 1.0000 | ORAL_TABLET | Freq: Four times a day (QID) | ORAL | Status: DC | PRN

## 2017-03-17 MED ORDER — MORPHINE SULFATE (PF) 2 MG/ML IV SOLN
0.5000 mg | INTRAVENOUS | Status: DC | PRN
  Administered 2017-03-17 – 2017-03-18 (×2): 0.5 mg via INTRAVENOUS
  Filled 2017-03-17 (×2): qty 1

## 2017-03-17 MED ORDER — DEXTROSE 50 % IV SOLN: 25.0000 mL | Freq: Once | INTRAVENOUS | Status: DC

## 2017-03-17 NOTE — Progress Notes (Addendum)
Pt unable to answer screening questions. Pt is mostly non-verbal. Family unavailable to answer questions. RN will answer objective questions. Pt is alert but disoriented X4. Pt will not speak. Shakes head when asked about pain but moans when turned. Small area of non-blanchable redness on sacrum, foam applied. Pt is not impulsive at this time. Pt cleaned with CHG wipes. Swabbed for MRSA. Lungs clear, S1 and S2 auscultated.

## 2017-03-17 NOTE — H&P (Signed)
History and Physical   [facility] [place] [facility] Admission History and Physical [employer], D.O.    Patient Name: Isaiah Mckenzie MR#: [id number] Date of Birth: July 04, 1927 Date of Admission: 03/17/2017  Referring MD/NP/PA: Dr. Roxan Hockey Primary Care Physician: [facility], Madaline Guthrie, MD Patient coming from: Home Outpatient Specialists: Home   Chief Complaint:  Chief Complaint  Patient presents with  . Hip Injury   Please note the entire history is obtained from the patient's emergency department chart, emergency department provider and the patient's family who is at the bedside. Patient's personal history is limited by dementia.    HPI: Isaiah Mckenzie is a 83 y.o. male with a known history of HTN, hyperthyroidism, kidney stone, memory loss/dementia presents to the emergency department for evaluation of mechanical fall.  Patient was in a usual state of health until he sustained a mechanical fall while standing up from the couch.  He denies preceding symptoms, no head trauma, no LOC.  His only complaint is right hip pain.   Functional capacity at home is limited. Patient lives with his son but was previously ambulatory.   Otherwise there has been no change in status. Patient has been taking medication as prescribed and there has been no recent change in medication or diet.  No recent antibiotics.  There has been no recent illness, hospitalizations, travel or sick contacts.    EMS/ED Course: Patient received Ancef, fentanyl, NS.  Review of Systems:  Unable to obtain secondary to dementia.    Past Medical History:  Diagnosis Date  . Hypertension   . Hyperthyroidism   . Kidney stone   . Memory loss     Past Surgical History:  Procedure Laterality Date  . EYE SURGERY  2014  . HERNIA REPAIR  2014     reports that he has never smoked. He uses smokeless tobacco. He reports that he does not drink alcohol or use drugs.  No Known Allergies  Family History   Problem Relation Age of Onset  . Breast cancer Sister   . Migraines Maternal Grandmother     2  . Heart attack Father   . Pneumonia Mother   . Kidney disease Neg Hx   . Prostate cancer Neg Hx     Prior to Admission medications   Medication Sig Start Date End Date Taking? Authorizing Provider  aspirin EC 81 MG tablet Take 1 tablet by mouth daily. Reported on 02/19/2016   Yes Historical Provider, MD  tamsulosin (FLOMAX) 0.4 MG CAPS capsule Take 1 capsule (0.4 mg total) by mouth daily. Patient not taking: Reported on 03/17/2017 01/28/16   Houston Siren, MD    Physical Exam: Vitals:   03/17/17 1800 03/17/17 1801 03/17/17 1954 03/17/17 2000  BP: (!) 169/101  (!) 145/86 137/78  Pulse: 75  69 67  Resp: Temp: 98.4 F (36.9 C)     TempSrc: Oral     SpO2: 100%  100% 100%  Weight:  50.8 kg (112 lb) 52.2 kg (115 lb)   Height:    (1.753 m)     GENERAL: 83 y.o.-year-old male patient, well-developed, well-nourished lying in the bed in no acute distress.  Pleasant and cooperative.   HEENT: Head atraumatic, normocephalic. Pupils equal, round, reactive to light and accommodation. No scleral icterus. Extraocular muscles intact. Nares are patent. Oropharynx is clear. Mucus membranes moist. NECK: Supple, full range of motion.  CHEST: Normal breath sounds bilaterally. No wheezing, rales, rhonchi or  crackles. No use of accessory muscles of respiration.  CARDIOVASCULAR: S1, S2 normal. SEM at LSB Cap refill <2 seconds. Pulses intact distally.  ABDOMEN: Soft, nondistended, nontender. No rebound, guarding, rigidity. Normoactive bowel sounds present in all four quadrants. No organomegaly or mass. EXTREMITIES:Right leg shortened and externally rotated.  No pedal edema, cyanosis, or clubbing. No calf tenderness or Homan's sign.  NEUROLOGIC: The patient is alert and oriented x 3. Cranial nerves II through XII are grossly intact with no focal sensorimotor deficit. Muscle strength 5/5 in all  extremities. Sensation intact. Gait not checked. PSYCHIATRIC:  Normal affect, mood, thought content.   Labs on Admission:  CBC:  Recent Labs Lab 03/17/17 1813  WBC 6.3  NEUTROABS 5.5  HGB 14.7  HCT 41.9  MCV 90.7  PLT 145*   Basic Metabolic Panel:  Recent Labs Lab 03/17/17 1813  NA 137  K 4.0  CL 104  CO2 29  GLUCOSE 121*  BUN 11  CREATININE 1.25*  CALCIUM 9.2   GFR: Estimated Creatinine Clearance: 30.2 mL/min (A) (by C-G formula based on SCr of 1.25 mg/dL (H)). Liver Function Tests: No results for input(s): AST, ALT, ALKPHOS, BILITOT, PROT, ALBUMIN in the last 168 hours. No results for input(s): LIPASE, AMYLASE in the last 168 hours. No results for input(s): AMMONIA in the last 168 hours. Coagulation Profile: No results for input(s): INR, PROTIME in the last 168 hours. Cardiac Enzymes:  Recent Labs Lab 03/17/17 1816  TROPONINI <0.03   BNP (last 3 results) No results for input(s): PROBNP in the last 8760 hours. HbA1C: No results for input(s): HGBA1C in the last 72 hours. CBG: No results for input(s): GLUCAP in the last 168 hours. Lipid Profile: No results for input(s): CHOL, HDL, LDLCALC, TRIG, CHOLHDL, LDLDIRECT in the last 72 hours. Thyroid Function Tests: No results for input(s): TSH, T4TOTAL, FREET4, T3FREE, THYROIDAB in the last 72 hours. Anemia Panel: No results for input(s): VITAMINB12, FOLATE, FERRITIN, TIBC, IRON, RETICCTPCT in the last 72 hours. Urine analysis:    Component Value Date/Time   COLORURINE YELLOW (A) 01/26/2016 0946   APPEARANCEUR CLEAR (A) 01/26/2016 0946   APPEARANCEUR Clear 09/30/2014 2352   LABSPEC 1.014 01/26/2016 0946   LABSPEC 1.014 09/30/2014 2352   PHURINE 6.0 01/26/2016 0946   GLUCOSEU NEGATIVE 01/26/2016 0946   GLUCOSEU Negative 09/30/2014 2352   HGBUR 2+ (A) 01/26/2016 0946   BILIRUBINUR NEGATIVE 01/26/2016 0946   BILIRUBINUR Negative 09/30/2014 2352   KETONESUR TRACE (A) 01/26/2016 0946   PROTEINUR NEGATIVE  01/26/2016 0946   NITRITE NEGATIVE 01/26/2016 0946   LEUKOCYTESUR NEGATIVE 01/26/2016 0946   LEUKOCYTESUR Negative 09/30/2014 2352   Sepsis Labs: (procalcitonin:4,lacticidven:4) )No results found for this or any previous visit (from the past 240 hour(s)).   Radiological Exams on Admission: Dg Pelvis 1-2 Views  Result Date: 03/17/2017 CLINICAL DATA:  Acute onset of right hip pain and deformity after fall at home. Found on floor. Initial encounter. EXAM: PELVIS - 1-2 VIEW COMPARISON:  CT of the abdomen and pelvis from 01/26/2016 FINDINGS: There is a comminuted right femoral intertrochanteric fracture, with displaced lesser trochanteric fragments. There is superior displacement of the distal femur. The right femoral head remains seated at the acetabulum. Surrounding soft tissue swelling is noted. The left hip joint is unremarkable in appearance. The sacroiliac joints are grossly unremarkable. The visualized bowel gas pattern is unremarkable. A mesh is seen at the right hemipelvis. IMPRESSION: Comminuted right femoral intertrochanteric fracture, with displaced lesser trochanteric fragments. Superior displacement of the  distal femur. Electronically Signed   By: Roanna Raider M.D.   On: 03/17/2017 19:01   Ct Head Wo Contrast  Result Date: 03/17/2017 CLINICAL DATA:  Fall. EXAM: CT HEAD WITHOUT CONTRAST TECHNIQUE: Contiguous axial images were obtained from the base of the skull through the vertex without intravenous contrast. COMPARISON:  01/17/2013. FINDINGS: Brain: There is no evidence for acute hemorrhage, hydrocephalus, mass lesion, or abnormal extra-axial fluid collection. No definite CT evidence for acute infarction. Diffuse loss of parenchymal volume is consistent with atrophy. Patchy low attenuation in the deep hemispheric and periventricular white matter is nonspecific, but likely reflects chronic microvascular ischemic demyelination. Lacunar infarcts noted in the basal ganglia  bilaterally. Vascular: No hyperdense vessel or unexpected calcification. Skull: No evidence for fracture. No worrisome lytic or sclerotic lesion. Sinuses/Orbits: The visualized paranasal sinuses and mastoid air cells are clear. Visualized portions of the globes and intraorbital fat are unremarkable. Other: None. IMPRESSION: Stable.  No acute intracranial abnormality. Atrophy with chronic small vessel white matter ischemic disease. Electronically Signed   By: Kennith Center M.D.   On: 03/17/2017 19:44   Dg Chest Portable 1 View  Result Date: 03/17/2017 CLINICAL DATA:  Status post fall, with concern for chest injury. Initial encounter. EXAM: PORTABLE CHEST 1 VIEW COMPARISON:  Chest radiograph performed 09/30/2014 FINDINGS: The lungs are well-aerated and clear. There is no evidence of focal opacification, pleural effusion or pneumothorax. The cardiomediastinal silhouette is within normal limits. No acute osseous abnormalities are seen. IMPRESSION: No acute cardiopulmonary process seen. No displaced rib fractures identified. Electronically Signed   By: Roanna Raider M.D.   On: 03/17/2017 19:08   Dg Femur Min 2 Views Right  Result Date: 03/17/2017 CLINICAL DATA:  Status post fall at home, with right hip pain and deformity. Found on floor. Initial encounter. EXAM: RIGHT FEMUR 2 VIEWS COMPARISON:  None. FINDINGS: There is a comminuted right femoral intertrochanteric fracture, with superior displacement of the distal femur, and displaced lesser trochanteric fragments. Surrounding soft tissue swelling is noted. No additional fractures are seen. The distal femur appears intact. No knee joint effusion is identified. Minimal cortical irregularity is noted at the patellofemoral compartment. The right femoral head remains seated at the acetabulum. The visualized bowel gas pattern is grossly unremarkable. IMPRESSION: Comminuted right femoral intertrochanteric fracture, with superior displacement of the distal femur, and  displaced lesser trochanteric fragments. Electronically Signed   By: Roanna Raider M.D.   On: 03/17/2017 19:07    EKG: Normal sinus rhythm at 60 bpm with normal axis and nonspecific ST-T wave changes.   Assessment/Plan  This is a 83 y.o. male with a history of HTN, hyperthyroidism, kidney stone, memory loss now being admitted with:  #. Right intertrochanteric hip fracture - Admit inpatient - NPO - Ortho surgery - Pain control and antiemetics - Hold aspirin - Given limited functional capacity, advanced age, no recent cardiac workup, will check echo and obtain cardiac clearance  #. History of HTN - Monitor BP per routine  #. CKD, stable, baseline - Monitor BMP  Admission status: Inpatient IV Fluids: NS Diet/Nutrition: NPO Consults called: Ortho  DVT Px: Heparin SCDs and early ambulation. Code Status: Full Code  Disposition Plan: To be determined  hype All the records are reviewed and case discussed with ED provider. Management plans discussed with the family who express understanding and agree with plan of care.  Bonna Steury D.O. on 03/17/2017 at 8:29 PM Between 7am to 6pm - Pager - [phone] After 6pm go to  [email or web address] - Social research officer, government [facility] [place] [facility] Office [phone] CC: Primary care physician; [facility], Madaline Guthrie, MD   03/17/2017, 8:29 PM

## 2017-03-17 NOTE — ED Triage Notes (Signed)
Pt brought in by [facility] from home for c/o right hip dislocation. Per EMS pt was sitting on couch, pt son heard pt fall and went in to check on him and found him in the floor. Pt has obvious deformity to the right hip. EMS reports outward rotation to the right leg. Pt currently has leg bent under him, per EMS unable to straighten leg out.   Pt has hx/o advanced dementia, pt A & O x 1, per family this is his baseline. Pt does not appear to be in any distress at this time, breathing is equal and unlabored, color is WNL, pt noted to be hypertensive on triage, hx/o same noted, per EMS pt has been off BP medication for over 6 months.

## 2017-03-17 NOTE — ED Notes (Signed)
Pt returned from xray

## 2017-03-17 NOTE — ED Provider Notes (Signed)
[facility] Emergency Department Provider Note    First MD Initiated Contact with Patient 03/17/17 1755     (approximate)  I have reviewed the triage vital signs and the nursing notes.   HISTORY  Chief Complaint Hip Injury    HPI Isaiah Mckenzie is a 83 y.o. male who presents to via EMS with obvious deformity and acute pain to his right hip. The patient was getting out of couch and fell to the ground. Had sudden onset hip pain and was unable to ambulate. Unsure if he  Hit his head. Denies any chest pain. He is not on any blood thinners.  Denies any other injuries or pain at this time.   Past Medical History:  Diagnosis Date  . Hypertension   . Hyperthyroidism   . Kidney stone   . Memory loss    Family History  Problem Relation Age of Onset  . Breast cancer Sister   . Migraines Maternal Grandmother     2  . Heart attack Father   . Pneumonia Mother   . Kidney disease Neg Hx   . Prostate cancer Neg Hx    Past Surgical History:  Procedure Laterality Date  . EYE SURGERY  2014  . HERNIA REPAIR  2014   Patient Active Problem List   Diagnosis Date Noted  . ARF (acute renal failure) (HCC) 01/26/2016      Prior to Admission medications   Medication Sig Start Date End Date Taking? Authorizing Provider  aspirin EC 81 MG tablet Take 1 tablet by mouth daily. Reported on 02/19/2016   Yes Historical Provider, MD  tamsulosin (FLOMAX) 0.4 MG CAPS capsule Take 1 capsule (0.4 mg total) by mouth daily. Patient not taking: Reported on 03/17/2017 01/28/16   Houston Siren, MD    Allergies Patient has no known allergies.    Social History Social History  Substance Use Topics  . Smoking status: Never Smoker  . Smokeless tobacco: Current User  . Alcohol use No    Review of Systems Patient denies headaches, rhinorrhea, blurry vision, numbness, shortness of breath, chest pain, edema, cough, abdominal pain, nausea, vomiting, diarrhea, dysuria,  fevers, rashes or hallucinations unless otherwise stated above in HPI. ____________________________________________   PHYSICAL EXAM:  VITAL SIGNS: Vitals:   03/17/17 1800  BP: (!) 169/101  Pulse: 75  Resp: 16  Temp: 98.4 F (36.9 C)    Constitutional: Alert elderly and frail appearing  Eyes: Conjunctivae are normal. PERRL. EOMI. Head: Atraumatic. Nose: No congestion/rhinnorhea. Mouth/Throat: Mucous membranes are moist.  Oropharynx non-erythematous. Neck: No stridor. Painless ROM. No cervical spine tenderness to palpation Hematological/Lymphatic/Immunilogical: No cervical lymphadenopathy. Cardiovascular: Normal rate, regular rhythm. Grossly normal heart sounds.  Good peripheral circulation. Respiratory: Normal respiratory effort.  No retractions. Lungs CTAB. Gastrointestinal: Soft and nontender. No distention. No abdominal bruits. No CVA tenderness. Musculoskeletal: right LE shortened and externally rotated.  2+ DP and PT pulses.  SILT distally.  Pelvis stable.  Femoral compartment soft Neurologic:  Normal speech and language. No facial droop Skin:  Skin is warm, dry and intact. No rash noted. Psychiatric: Mood and affect are normal.  ____________________________________________   LABS (all labs ordered are listed, but only abnormal results are displayed)  Results for orders placed or performed during the hospital encounter of 03/17/17 (from the past 24 hour(s))  CBC with Differential/Platelet     Status: Abnormal   Collection Time: 03/17/17  6:13 PM  Result Value Ref Range   WBC 6.3  3.8 - 10.6 K/uL   RBC 4.62 4.40 - 5.90 MIL/uL   Hemoglobin 14.7 13.0 - 18.0 g/dL   HCT 91.4 78.2 - 95.6 %   MCV 90.7 80.0 - 100.0 fL   MCH 31.8 26.0 - 34.0 pg   MCHC 35.1 32.0 - 36.0 g/dL   RDW 21.3 08.6 - 57.8 %   Platelets 145 (L) 150 - 440 K/uL   Neutrophils Relative % 87 %   Neutro Abs 5.5 1.4 - 6.5 K/uL   Lymphocytes Relative 8 %   Lymphs Abs 0.5 (L) 1.0 - 3.6 K/uL   Monocytes  Relative 4 %   Monocytes Absolute 0.2 0.2 - 1.0 K/uL   Eosinophils Relative 1 %   Eosinophils Absolute 0.1 0 - 0.7 K/uL   Basophils Relative 0 %   Basophils Absolute 0.0 0 - 0.1 K/uL  Basic metabolic panel     Status: Abnormal   Collection Time: 03/17/17  6:13 PM  Result Value Ref Range   Sodium 137 135 - 145 mmol/L   Potassium 4.0 3.5 - 5.1 mmol/L   Chloride 104 101 - 111 mmol/L   CO2 29 22 - 32 mmol/L   Glucose, Bld 121 (H) 65 - 99 mg/dL   BUN 11 6 - 20 mg/dL   Creatinine, Ser 4.69 (H) 0.61 - 1.24 mg/dL   Calcium 9.2 8.9 - 62.9 mg/dL   GFR calc non Af Amer 50 (L) >60 mL/min   GFR calc Af Amer 57 (L) >60 mL/min   Anion gap 4 (L) 5 - 15  Troponin I     Status: None   Collection Time: 03/17/17  6:16 PM  Result Value Ref Range   Troponin I <0.03 <0.03 ng/mL   ____________________________________________  EKG My review and personal interpretation at Time: 18:05   Indication: fall  Rate: 60  Rhythm: sinus Axis: normal Other: baseline wander, no acute st elevations or depressions ____________________________________________  RADIOLOGY  I personally reviewed all radiographic images ordered to evaluate for the above acute complaints and reviewed radiology reports and findings.  These findings were personally discussed with the patient.  Please see medical record for radiology report.  ____________________________________________   PROCEDURES  Procedure(s) performed:  Procedures    Critical Care performed: no ____________________________________________   INITIAL IMPRESSION / ASSESSMENT AND PLAN / ED COURSE  Pertinent labs & imaging results that were available during my care of the patient were reviewed by me and considered in my medical decision making (see chart for details).  DDX: fracture, dislocation, contusion  Isaiah Mckenzie is a 83 y.o. who presents to the ED with acute right  hip injury. Denies any other injuries.  CT head ordered to eval for  headinjury shows none.  Denies neck pain.  Denies motor or sensory loss in extremity. VSS in ED. Exam as above. NV intact throughout and distal to injury. 2+ distal pulses. Obvious deformity to right hip.. No clinical suspicion for infectious process or septic joint. Getting X-rays to r/o fracture. No other injuries reported or noted on exam. X-rays with evidence of acute intertrochanteric fracture with displacement. Have discussed with the patient and available family all diagnostics and treatments performed thus far and all questions were answered to the best of my ability. The patient demonstrates understanding and agreement with plan.        ____________________________________________   FINAL CLINICAL IMPRESSION(S) / ED DIAGNOSES  Final diagnoses:  Intertrochanteric fracture of right femur, closed, initial encounter (HCC)  Fall, initial  encounter      NEW MEDICATIONS STARTED DURING THIS VISIT:  New Prescriptions   No medications on file     Note:  This document was prepared using Dragon voice recognition software and may include unintentional dictation errors.    Willy Eddy, MD 03/17/17 [phone]

## 2017-03-18 ENCOUNTER — Inpatient Hospital Stay: Payer: Medicare Other

## 2017-03-18 ENCOUNTER — Inpatient Hospital Stay: Payer: Medicare Other | Admitting: Anesthesiology

## 2017-03-18 ENCOUNTER — Encounter
Admission: EM | Disposition: A | Discharge status: Skilled Nursing Facility | Payer: Self-pay | Source: Home / Self Care | Attending: Specialist

## 2017-03-18 DIAGNOSIS — L8990 Pressure ulcer of unspecified site, unspecified stage: Secondary | ICD-10-CM | POA: Insufficient documentation

## 2017-03-18 HISTORY — PX: INTRAMEDULLARY (IM) NAIL INTERTROCHANTERIC: SHX5875

## 2017-03-18 SURGERY — FIXATION, FRACTURE, INTERTROCHANTERIC, WITH INTRAMEDULLARY ROD
Anesthesia: General | Laterality: Right

## 2017-03-18 MED ORDER — FENTANYL CITRATE (PF) 100 MCG/2ML IJ SOLN
INTRAMUSCULAR | Status: DC | PRN
  Administered 2017-03-18 (×2): 50 ug via INTRAVENOUS

## 2017-03-18 MED ORDER — ONDANSETRON HCL 4 MG/2ML IJ SOLN: 4.0000 mg | Freq: Once | INTRAMUSCULAR | Status: DC | PRN

## 2017-03-18 MED ORDER — KETAMINE HCL 50 MG/ML IJ SOLN
INTRAMUSCULAR | Status: AC
  Filled 2017-03-18: qty 10

## 2017-03-18 MED ORDER — ARTIFICIAL TEARS OP OINT
TOPICAL_OINTMENT | OPHTHALMIC | Status: AC
  Filled 2017-03-18: qty 3.5

## 2017-03-18 MED ORDER — ONDANSETRON HCL 4 MG/2ML IJ SOLN: 4.0000 mg | Freq: Four times a day (QID) | INTRAMUSCULAR | Status: DC | PRN

## 2017-03-18 MED ORDER — FENTANYL CITRATE (PF) 100 MCG/2ML IJ SOLN
25.0000 ug | INTRAMUSCULAR | Status: DC | PRN
  Administered 2017-03-18: 25 ug via INTRAVENOUS

## 2017-03-18 MED ORDER — MIDAZOLAM HCL 2 MG/2ML IJ SOLN
INTRAMUSCULAR | Status: DC | PRN
  Administered 2017-03-18: 1 mg via INTRAVENOUS

## 2017-03-18 MED ORDER — PHENYLEPHRINE HCL 10 MG/ML IJ SOLN
INTRAMUSCULAR | Status: DC | PRN
  Administered 2017-03-18 (×8): 100 ug via INTRAVENOUS

## 2017-03-18 MED ORDER — OXYCODONE HCL 5 MG PO TABS
5.0000 mg | ORAL_TABLET | ORAL | Status: DC | PRN
  Administered 2017-03-20 – 2017-03-21 (×2): 5 mg via ORAL
  Filled 2017-03-18 (×3): qty 1

## 2017-03-18 MED ORDER — METOCLOPRAMIDE HCL 10 MG PO TABS: 5.0000 mg | ORAL_TABLET | Freq: Three times a day (TID) | ORAL | Status: DC | PRN

## 2017-03-18 MED ORDER — CEFAZOLIN SODIUM-DEXTROSE 2-4 GM/100ML-% IV SOLN: 2.0000 g | Freq: Four times a day (QID) | INTRAVENOUS | Status: DC

## 2017-03-18 MED ORDER — ENOXAPARIN SODIUM 30 MG/0.3ML ~~LOC~~ SOLN
30.0000 mg | SUBCUTANEOUS | Status: DC
Start: 2017-03-19 — End: 2017-03-21
  Administered 2017-03-19 – 2017-03-21 (×3): 30 mg via SUBCUTANEOUS
  Filled 2017-03-18 (×3): qty 0.3

## 2017-03-18 MED ORDER — NEOMYCIN-POLYMYXIN B GU 40-200000 IR SOLN
Status: AC
  Filled 2017-03-18: qty 2

## 2017-03-18 MED ORDER — ONDANSETRON HCL 4 MG PO TABS: 4.0000 mg | ORAL_TABLET | Freq: Four times a day (QID) | ORAL | Status: DC | PRN

## 2017-03-18 MED ORDER — BISACODYL 10 MG RE SUPP
10.0000 mg | Freq: Every day | RECTAL | Status: DC | PRN
  Administered 2017-03-20 – 2017-03-21 (×2): 10 mg via RECTAL
  Filled 2017-03-18 (×2): qty 1

## 2017-03-18 MED ORDER — OXYMETAZOLINE HCL 0.05 % NA SOLN
NASAL | Status: AC
  Filled 2017-03-18: qty 15

## 2017-03-18 MED ORDER — ACETAMINOPHEN 500 MG PO TABS: 1000.0000 mg | ORAL_TABLET | Freq: Four times a day (QID) | ORAL | Status: AC

## 2017-03-18 MED ORDER — MIDAZOLAM HCL 2 MG/2ML IJ SOLN
INTRAMUSCULAR | Status: AC
  Filled 2017-03-18: qty 2

## 2017-03-18 MED ORDER — MIDAZOLAM HCL 5 MG/5ML IJ SOLN
INTRAMUSCULAR | Status: DC | PRN
  Administered 2017-03-18 (×2): 1 mg via INTRAVENOUS

## 2017-03-18 MED ORDER — METOCLOPRAMIDE HCL 5 MG/ML IJ SOLN: 5.0000 mg | Freq: Three times a day (TID) | INTRAMUSCULAR | Status: DC | PRN

## 2017-03-18 MED ORDER — EPHEDRINE SULFATE 50 MG/ML IJ SOLN
INTRAMUSCULAR | Status: DC | PRN
  Administered 2017-03-18 (×2): 100 mg via INTRAVENOUS

## 2017-03-18 MED ORDER — FENTANYL CITRATE (PF) 100 MCG/2ML IJ SOLN
INTRAMUSCULAR | Status: AC
  Filled 2017-03-18: qty 2

## 2017-03-18 MED ORDER — CEFAZOLIN SODIUM-DEXTROSE 2-3 GM-% IV SOLR
INTRAVENOUS | Status: DC | PRN
  Administered 2017-03-18: 2 g via INTRAVENOUS

## 2017-03-18 MED ORDER — ACETAMINOPHEN 325 MG PO TABS: 650.0000 mg | ORAL_TABLET | Freq: Four times a day (QID) | ORAL | Status: DC | PRN

## 2017-03-18 MED ORDER — PROPOFOL 10 MG/ML IV BOLUS
INTRAVENOUS | Status: DC | PRN
  Administered 2017-03-18: 70 mg via INTRAVENOUS

## 2017-03-18 MED ORDER — ACETAMINOPHEN 650 MG RE SUPP: 650.0000 mg | Freq: Four times a day (QID) | RECTAL | Status: DC | PRN

## 2017-03-18 MED ORDER — DEXTROSE 5 % IV SOLN
2.0000 g | Freq: Four times a day (QID) | INTRAVENOUS | Status: AC
  Administered 2017-03-18 – 2017-03-19 (×3): 2 g via INTRAVENOUS
  Filled 2017-03-18 (×3): qty 2000

## 2017-03-18 MED ORDER — FLEET ENEMA 7-19 GM/118ML RE ENEM
1.0000 | ENEMA | Freq: Once | RECTAL | Status: AC | PRN
  Administered 2017-03-21: 1 via RECTAL

## 2017-03-18 MED ORDER — HYDROMORPHONE HCL 1 MG/ML IJ SOLN
0.5000 mg | INTRAMUSCULAR | Status: DC | PRN
Start: 2017-03-18 — End: 2017-03-19
  Administered 2017-03-18 – 2017-03-19 (×2): 1 mg via INTRAVENOUS
  Filled 2017-03-18 (×2): qty 1

## 2017-03-18 MED ORDER — DIPHENHYDRAMINE HCL 12.5 MG/5ML PO ELIX: 12.5000 mg | ORAL_SOLUTION | ORAL | Status: DC | PRN

## 2017-03-18 MED ORDER — KCL IN DEXTROSE-NACL 20-5-0.9 MEQ/L-%-% IV SOLN
INTRAVENOUS | Status: DC
  Administered 2017-03-18 – 2017-03-19 (×3): via INTRAVENOUS
  Filled 2017-03-18 (×8): qty 1000

## 2017-03-18 MED ORDER — DOCUSATE SODIUM 100 MG PO CAPS
100.0000 mg | ORAL_CAPSULE | Freq: Two times a day (BID) | ORAL | Status: DC
  Administered 2017-03-18: 100 mg via ORAL
  Filled 2017-03-18 (×2): qty 1

## 2017-03-18 MED ORDER — BUPIVACAINE-EPINEPHRINE (PF) 0.5% -1:200000 IJ SOLN
INTRAMUSCULAR | Status: DC | PRN
  Administered 2017-03-18: 30 mL

## 2017-03-18 MED ORDER — LACTATED RINGERS IV SOLN
INTRAVENOUS | Status: DC | PRN
  Administered 2017-03-18: 10:00:00 via INTRAVENOUS

## 2017-03-18 MED ORDER — PROPOFOL 500 MG/50ML IV EMUL
INTRAVENOUS | Status: AC
  Filled 2017-03-18: qty 50

## 2017-03-18 MED ORDER — BUPIVACAINE-EPINEPHRINE (PF) 0.5% -1:200000 IJ SOLN
INTRAMUSCULAR | Status: AC
  Filled 2017-03-18: qty 30

## 2017-03-18 MED ORDER — MAGNESIUM HYDROXIDE 400 MG/5ML PO SUSP
30.0000 mL | Freq: Every day | ORAL | Status: DC | PRN
  Administered 2017-03-21: 30 mL via ORAL
  Filled 2017-03-18: qty 30

## 2017-03-18 MED ORDER — NEOMYCIN-POLYMYXIN B GU 40-200000 IR SOLN
Status: DC | PRN
  Administered 2017-03-18: 2 mL

## 2017-03-18 SURGICAL SUPPLY — 38 items
BIT DRILL 4.3MMS DISTAL GRDTED (BIT) ×1 IMPLANT
BNDG COHESIVE 4X5 TAN STRL (GAUZE/BANDAGES/DRESSINGS) ×3 IMPLANT
BNDG COHESIVE 6X5 TAN STRL LF (GAUZE/BANDAGES/DRESSINGS) ×3 IMPLANT
CANISTER SUCT 1200ML W/VALVE (MISCELLANEOUS) ×3 IMPLANT
CHLORAPREP W/TINT 26ML (MISCELLANEOUS) ×6 IMPLANT
DRAPE C-ARMOR (DRAPES) ×3 IMPLANT
DRAPE SHEET LG 3/4 BI-LAMINATE (DRAPES) ×3 IMPLANT
DRILL 4.3MMS DISTAL GRADUATED (BIT) ×3
DRSG OPSITE POSTOP 3X4 (GAUZE/BANDAGES/DRESSINGS) ×9 IMPLANT
DRSG OPSITE POSTOP 4X6 (GAUZE/BANDAGES/DRESSINGS) IMPLANT
ELECT CAUTERY BLADE 6.4 (BLADE) ×3 IMPLANT
ELECT REM PT RETURN 9FT ADLT (ELECTROSURGICAL) ×3
ELECTRODE REM PT RTRN 9FT ADLT (ELECTROSURGICAL) ×1 IMPLANT
GAUZE SPONGE 4X4 12PLY STRL (GAUZE/BANDAGES/DRESSINGS) ×3 IMPLANT
GLOVE BIO SURGEON STRL SZ8 (GLOVE) ×6 IMPLANT
GLOVE INDICATOR 8.0 STRL GRN (GLOVE) ×3 IMPLANT
GOWN STRL REUS W/ TWL LRG LVL3 (GOWN DISPOSABLE) ×1 IMPLANT
GOWN STRL REUS W/ TWL XL LVL3 (GOWN DISPOSABLE) ×1 IMPLANT
GOWN STRL REUS W/TWL LRG LVL3 (GOWN DISPOSABLE) ×2
GOWN STRL REUS W/TWL XL LVL3 (GOWN DISPOSABLE) ×2
GUIDEPIN VERSANAIL DSP 3.2X444 (ORTHOPEDIC DISPOSABLE SUPPLIES) ×3 IMPLANT
GUIDEWIRE BALL NOSE 100CM (WIRE) ×3 IMPLANT
MAT BLUE FLOOR 46X72 FLO (MISCELLANEOUS) ×6 IMPLANT
NAIL HIP FRACTURE 11X380MM (Nail) ×3 IMPLANT
NEEDLE FILTER BLUNT 18X 1/2SAF (NEEDLE) ×2
NEEDLE FILTER BLUNT 18X1 1/2 (NEEDLE) ×1 IMPLANT
NEEDLE HYPO 22GX1.5 SAFETY (NEEDLE) ×3 IMPLANT
NS IRRIG 500ML POUR BTL (IV SOLUTION) ×3 IMPLANT
PACK HIP COMPR (MISCELLANEOUS) ×3 IMPLANT
SCREW BONE CORTICAL 5.0X38 (Screw) ×3 IMPLANT
SCREW LAG HIP NAIL 10.5X95 (Screw) ×3 IMPLANT
STAPLER SKIN PROX 35W (STAPLE) ×3 IMPLANT
STRAP SAFETY BODY (MISCELLANEOUS) ×3 IMPLANT
SUT VIC AB 1 CT1 36 (SUTURE) ×3 IMPLANT
SUT VIC AB 2-0 CT1 (SUTURE) ×6 IMPLANT
SYR 30ML LL (SYRINGE) ×3 IMPLANT
SYRINGE 10CC LL (SYRINGE) ×3 IMPLANT
TAPE MICROFOAM 4IN (TAPE) IMPLANT

## 2017-03-18 NOTE — Anesthesia Preprocedure Evaluation (Addendum)
Anesthesia Evaluation  Patient identified by MRN, date of birth, ID band Patient confused and Patient unresponsive  General Assessment Comment:Spoke with pt's son, who gave consent and review of systems  Reviewed: Allergy & Precautions, NPO status , Patient's Chart, lab work & pertinent test results  History of Anesthesia Complications Negative for: history of anesthetic complications  Airway Mallampati: II      Comment: Pt unable to open mouth on command Dental   Pulmonary neg pulmonary ROS,           Cardiovascular hypertension (no meds),      Neuro/Psych dementia    GI/Hepatic negative GI ROS, Neg liver ROS,   Endo/Other  Hyperthyroidism   Renal/GU Renal disease (stones)     Musculoskeletal   Abdominal   Peds  Hematology negative hematology ROS (+)   Anesthesia Other Findings   Reproductive/Obstetrics                           Anesthesia Physical Anesthesia Plan  ASA: IV  Anesthesia Plan: General   Post-op Pain Management:    Induction: Intravenous  Airway Management Planned: Oral ETT and LMA  Additional Equipment:   Intra-op Plan:   Post-operative Plan:   Informed Consent: I have reviewed the patients History and Physical, chart, labs and discussed the procedure including the risks, benefits and alternatives for the proposed anesthesia with the patient or authorized representative who has indicated his/her understanding and acceptance.     Plan Discussed with:   Anesthesia Plan Comments:         Anesthesia Quick Evaluation

## 2017-03-18 NOTE — Progress Notes (Signed)
Unable to insert catheter at this time. 16 and 14 [place] foleys are too large.

## 2017-03-18 NOTE — Transfer of Care (Signed)
Immediate Anesthesia Transfer of Care Note  Patient: Isaiah Mckenzie  Procedure(s) Performed: Procedure(s): INTRAMEDULLARY (IM) NAIL INTERTROCHANTRIC (Right)  Patient Location: PACU  Anesthesia Type:General  Level of Consciousness: awake, alert  and sedated  Airway & Oxygen Therapy: Patient Spontanous Breathing and Patient connected to face mask oxygen  Post-op Assessment: Report given to RN and Post -op Vital signs reviewed and stable  Post vital signs: Reviewed and stable  Last Vitals:  Vitals:   03/18/17 0925 03/18/17 1152  BP: (!) 161/82 (!) 151/73  Pulse: 97 90  Resp:  15  Temp:  37.1 C    Last Pain:  Vitals:   03/18/17 0600  TempSrc: Axillary         Complications: No apparent anesthesia complications

## 2017-03-18 NOTE — Anesthesia Postprocedure Evaluation (Signed)
Anesthesia Post Note  Patient: Isaiah Mckenzie  Procedure(s) Performed: Procedure(s) (LRB): INTRAMEDULLARY (IM) NAIL INTERTROCHANTRIC (Right)  Patient location during evaluation: PACU Anesthesia Type: General Level of consciousness: confused Pain management: pain level controlled Vital Signs Assessment: post-procedure vital signs reviewed and stable Respiratory status: spontaneous breathing and respiratory function stable Cardiovascular status: stable Anesthetic complications: no     Last Vitals:  Vitals:   03/18/17 1238 03/18/17 1259  BP: (!) 149/72 137/68  Pulse:    Resp:    Temp:  36.8 C    Last Pain:  Vitals:   03/18/17 0600  TempSrc: Axillary                 [name]

## 2017-03-18 NOTE — Consult Note (Signed)
ORTHOPAEDIC CONSULTATION  REQUESTING PHYSICIAN: Houston Siren, MD  Chief Complaint:   Right hip pain.  History of Present Illness: Isaiah Mckenzie is a 83 y.o. male with a history of hypertension, hyperthyroidism, and dementia who lives with his son. Apparently, the patient was resting on the couch yesterday afternoon. While attempting to get up from the couch, he lost his balance and fell, landing on his right side. He was unable to get up, so EMS was called and he was brought to the hospital where x-rays demonstrated a displaced intertrochanteric fracture of his right hip. The patient denies any associated injuries. Hip and he did not strike his head or lose consciousness. The patient also denies any chest pain, lightheadedness, dizziness, or other symptoms may have precipitated his fall. The fall apparently was witnessed by his son.  Past Medical History:  Diagnosis Date  . Hypertension   . Hyperthyroidism   . Kidney stone   . Memory loss    Past Surgical History:  Procedure Laterality Date  . EYE SURGERY  2014  . HERNIA REPAIR  2014   Social History   Social History  . Marital status: Widowed    Spouse name: N/A  . Number of children: N/A  . Years of education: N/A   Social History Main Topics  . Smoking status: Never Smoker  . Smokeless tobacco: Current User  . Alcohol use No  . Drug use: No  . Sexual activity: Not Asked   Other Topics Concern  . None   Social History Narrative  . None   Family History  Problem Relation Age of Onset  . Breast cancer Sister   . Migraines Maternal Grandmother     2  . Heart attack Father   . Pneumonia Mother   . Kidney disease Neg Hx   . Prostate cancer Neg Hx    No Known Allergies Prior to Admission medications   Medication Sig Start Date End Date Taking? Authorizing Provider  aspirin EC 81 MG tablet Take 1 tablet by mouth daily. Reported on 02/19/2016    Yes Historical Provider, MD  tamsulosin (FLOMAX) 0.4 MG CAPS capsule Take 1 capsule (0.4 mg total) by mouth daily. Patient not taking: Reported on 03/17/2017 01/28/16   Houston Siren, MD   Dg Pelvis 1-2 Views  Result Date: 03/17/2017 CLINICAL DATA:  Acute onset of right hip pain and deformity after fall at home. Found on floor. Initial encounter. EXAM: PELVIS - 1-2 VIEW COMPARISON:  CT of the abdomen and pelvis from 01/26/2016 FINDINGS: There is a comminuted right femoral intertrochanteric fracture, with displaced lesser trochanteric fragments. There is superior displacement of the distal femur. The right femoral head remains seated at the acetabulum. Surrounding soft tissue swelling is noted. The left hip joint is unremarkable in appearance. The sacroiliac joints are grossly unremarkable. The visualized bowel gas pattern is unremarkable. A mesh is seen at the right hemipelvis. IMPRESSION: Comminuted right femoral intertrochanteric fracture, with displaced lesser trochanteric fragments. Superior displacement of the distal femur. Electronically Signed   By: Roanna Raider M.D.   On: 03/17/2017 19:01   Ct Head Wo Contrast  Result Date: 03/17/2017 CLINICAL DATA:  Fall. EXAM: CT HEAD WITHOUT CONTRAST TECHNIQUE: Contiguous axial images were obtained from the base of the skull through the vertex without intravenous contrast. COMPARISON:  01/17/2013. FINDINGS: Brain: There is no evidence for acute hemorrhage, hydrocephalus, mass lesion, or abnormal extra-axial fluid collection. No definite CT evidence for acute infarction. Diffuse loss of  parenchymal volume is consistent with atrophy. Patchy low attenuation in the deep hemispheric and periventricular white matter is nonspecific, but likely reflects chronic microvascular ischemic demyelination. Lacunar infarcts noted in the basal ganglia bilaterally. Vascular: No hyperdense vessel or unexpected calcification. Skull: No evidence for fracture. No worrisome lytic or  sclerotic lesion. Sinuses/Orbits: The visualized paranasal sinuses and mastoid air cells are clear. Visualized portions of the globes and intraorbital fat are unremarkable. Other: None. IMPRESSION: Stable.  No acute intracranial abnormality. Atrophy with chronic small vessel white matter ischemic disease. Electronically Signed   By: Kennith Center M.D.   On: 03/17/2017 19:44   Dg Chest Portable 1 View  Result Date: 03/17/2017 CLINICAL DATA:  Status post fall, with concern for chest injury. Initial encounter. EXAM: PORTABLE CHEST 1 VIEW COMPARISON:  Chest radiograph performed 09/30/2014 FINDINGS: The lungs are well-aerated and clear. There is no evidence of focal opacification, pleural effusion or pneumothorax. The cardiomediastinal silhouette is within normal limits. No acute osseous abnormalities are seen. IMPRESSION: No acute cardiopulmonary process seen. No displaced rib fractures identified. Electronically Signed   By: Roanna Raider M.D.   On: 03/17/2017 19:08   Dg Femur Min 2 Views Right  Result Date: 03/17/2017 CLINICAL DATA:  Status post fall at home, with right hip pain and deformity. Found on floor. Initial encounter. EXAM: RIGHT FEMUR 2 VIEWS COMPARISON:  None. FINDINGS: There is a comminuted right femoral intertrochanteric fracture, with superior displacement of the distal femur, and displaced lesser trochanteric fragments. Surrounding soft tissue swelling is noted. No additional fractures are seen. The distal femur appears intact. No knee joint effusion is identified. Minimal cortical irregularity is noted at the patellofemoral compartment. The right femoral head remains seated at the acetabulum. The visualized bowel gas pattern is grossly unremarkable. IMPRESSION: Comminuted right femoral intertrochanteric fracture, with superior displacement of the distal femur, and displaced lesser trochanteric fragments. Electronically Signed   By: Roanna Raider M.D.   On: 03/17/2017 19:07    Positive  ROS: All other systems have been reviewed and were otherwise negative with the exception of those mentioned in the HPI and as above.  Physical Exam: General:  Alert, no acute distress Psychiatric:  Patient is not competent for consent   Cardiovascular:  No pedal edema Respiratory:  No wheezing, non-labored breathing GI:  Abdomen is soft and non-tender Skin:  No lesions in the area of chief complaint Neurologic:  Sensation intact distally Lymphatic:  No axillary or cervical lymphadenopathy  Orthopedic Exam:  Orthopedic examination is limited to the right hip and lower extremity. The right lower extremity is somewhat shortened external rotated as compared to the left. Skin inspection around the right hip is unremarkable. There is no rashes, erythema, ecchymosis, or abrasions. There is some mild swelling around the hip area however. There is mild-moderate tenderness to palpation over the lateral aspect of the hip. He has more significant pain with any attempted active or passive motion of the hip. He is able to actively dorsiflex and plantarflex his toes and ankle periods and sensation is intact to light touch to his foot. He has good capillary refill to his right foot.  X-rays:  X-rays of the pelvis and right hip are available for review. These films demonstrate a displaced intertrochanteric fracture of the right hip. The hip joint itself is well maintained and without any evidence for significant degenerative changes. No lytic lesions are identified.  Assessment: Displaced intertrochanteric fracture right hip.  Plan: The treatment options are discussed with the  patient's son, as the patient is demented and unable to understand the situation. The specific recommended treatment is an intramedullary nailing of the right intertrochanteric hip fracture. This procedure has been discussed in detail with the patient, as have the potential risks (including bleeding, infection, nerve and/or blood vessel  injury, persistent or recurrent pain, stiffness, malunion and/or nonunion, leg length inequality, need for further surgery, blood clots, strokes, heart attacks and arrhythmias, etc.) and benefits. The patient's son states his understanding and wishes to proceed on behalf of his father. A formal written consent has been obtained.  I will plan on taking him to the operating room this morning as he is being cleared by cardiology at this time. Thank you for asking me to protect this. In the care of this most unfortunate man. I will be happy to follow him with you.   Maryagnes Amos, MD  Beeper #:  [phone]  03/18/2017 8:52 AM

## 2017-03-18 NOTE — Progress Notes (Signed)
Pt refuses PO medications. Scoring 5 on Pain AD scale. Will administer IV pain medication. Pt is alert and disoriented x4. Condom cath is intact and draining yellow/clear urine. Will continue to assess.

## 2017-03-18 NOTE — Progress Notes (Signed)
[employer] [facility] [place] [facility]   PATIENT NAME: Isaiah Mckenzie    MR#:  [id number]  DATE OF BIRTH:  04-Aug-1927  SUBJECTIVE:   She is here after a fall and noted to have a right hip fracture. Status post right hip pinning today. Seen in the PACU postoperatively. Patient is lethargic and noncommunicative presently. Also has baseline underlying dementia.  REVIEW OF SYSTEMS:    Review of Systems  Unable to perform ROS: Dementia    Nutrition: Regular Tolerating Diet: Yes Tolerating PT: Await Eval.      DRUG ALLERGIES:  No Known Allergies  VITALS:  Blood pressure 137/68, pulse 77, temperature 98.2 F (36.8 C), resp. rate 12, height  (1.676 m), weight 53.4 kg (117 lb 11.2 oz), SpO2 99 %.  PHYSICAL EXAMINATION:   Physical Exam  GENERAL:  83 y.o.-year-old patient lying in bed in no acute distress.  EYES: Pupils equal, round, reactive to light. No scleral icterus. Extraocular muscles intact.  HEENT: Head atraumatic, normocephalic. Oropharynx and nasopharynx clear.  NECK:  Supple, no jugular venous distention. No thyroid enlargement, no tenderness.  LUNGS: Normal breath sounds bilaterally, no wheezing, rales, rhonchi. No use of accessory muscles of respiration.  CARDIOVASCULAR: S1, S2 normal. No murmurs, rubs, or gallops.  ABDOMEN: Soft, nontender, nondistended. Bowel sounds present. No organomegaly or mass.  EXTREMITIES: No cyanosis, clubbing or edema b/l.   Right Hip dressing from surgery.  NEUROLOGIC: Cranial nerves II through XII are intact. No focal Motor or sensory deficits b/l.  Globally weak PSYCHIATRIC: The patient is alert and oriented x 1.   SKIN: No obvious rash, lesion, or ulcer.    LABORATORY PANEL:   CBC  Recent Labs Lab 03/17/17 1813  WBC 6.3  HGB 14.7  HCT 41.9  PLT 145*   ------------------------------------------------------------------------------------------------------------------  Chemistries   Recent Labs Lab  03/17/17 1813  NA 137  K 4.0  CL 104  CO2 29  GLUCOSE 121*  BUN 11  CREATININE 1.25*  CALCIUM 9.2   ------------------------------------------------------------------------------------------------------------------  Cardiac Enzymes  Recent Labs Lab 03/17/17 1816  TROPONINI <0.03   ------------------------------------------------------------------------------------------------------------------  RADIOLOGY:  Dg Pelvis 1-2 Views  Result Date: 03/17/2017 CLINICAL DATA:  Acute onset of right hip pain and deformity after fall at home. Found on floor. Initial encounter. EXAM: PELVIS - 1-2 VIEW COMPARISON:  CT of the abdomen and pelvis from 01/26/2016 FINDINGS: There is a comminuted right femoral intertrochanteric fracture, with displaced lesser trochanteric fragments. There is superior displacement of the distal femur. The right femoral head remains seated at the acetabulum. Surrounding soft tissue swelling is noted. The left hip joint is unremarkable in appearance. The sacroiliac joints are grossly unremarkable. The visualized bowel gas pattern is unremarkable. A mesh is seen at the right hemipelvis. IMPRESSION: Comminuted right femoral intertrochanteric fracture, with displaced lesser trochanteric fragments. Superior displacement of the distal femur. Electronically Signed   By: Roanna Raider M.D.   On: 03/17/2017 19:01   Ct Head Wo Contrast  Result Date: 03/17/2017 CLINICAL DATA:  Fall. EXAM: CT HEAD WITHOUT CONTRAST TECHNIQUE: Contiguous axial images were obtained from the base of the skull through the vertex without intravenous contrast. COMPARISON:  01/17/2013. FINDINGS: Brain: There is no evidence for acute hemorrhage, hydrocephalus, mass lesion, or abnormal extra-axial fluid collection. No definite CT evidence for acute infarction. Diffuse loss of parenchymal volume is consistent with atrophy. Patchy low attenuation in the deep hemispheric and periventricular white matter is  nonspecific, but likely reflects chronic microvascular ischemic  demyelination. Lacunar infarcts noted in the basal ganglia bilaterally. Vascular: No hyperdense vessel or unexpected calcification. Skull: No evidence for fracture. No worrisome lytic or sclerotic lesion. Sinuses/Orbits: The visualized paranasal sinuses and mastoid air cells are clear. Visualized portions of the globes and intraorbital fat are unremarkable. Other: None. IMPRESSION: Stable.  No acute intracranial abnormality. Atrophy with chronic small vessel white matter ischemic disease. Electronically Signed   By: Kennith Center M.D.   On: 03/17/2017 19:44   Dg Chest Portable 1 View  Result Date: 03/17/2017 CLINICAL DATA:  Status post fall, with concern for chest injury. Initial encounter. EXAM: PORTABLE CHEST 1 VIEW COMPARISON:  Chest radiograph performed 09/30/2014 FINDINGS: The lungs are well-aerated and clear. There is no evidence of focal opacification, pleural effusion or pneumothorax. The cardiomediastinal silhouette is within normal limits. No acute osseous abnormalities are seen. IMPRESSION: No acute cardiopulmonary process seen. No displaced rib fractures identified. Electronically Signed   By: Roanna Raider M.D.   On: 03/17/2017 19:08   Dg Hip Operative Unilat W Or W/o Pelvis Right  Result Date: 03/18/2017 CLINICAL DATA:  Right hip fracture EXAM: OPERATIVE RIGHT HIP (WITH PELVIS IF PERFORMED)  VIEWS TECHNIQUE: Fluoroscopic spot image(s) were submitted for interpretation post-operatively. COMPARISON:  None. FINDINGS: Four intraoperative fluoroscopic spot images are provided showing intra Mary early rod with compression screw fixation of the right femur. Where appears intact and appropriately positioned. Osseous alignment is anatomic. No evidence of surgical complicating feature. Fluoroscopy provided for 1 minutes 28 seconds. IMPRESSION: Intraoperative fluoroscopic spot images showing rod and screw fixation of the proximal right  femur. Hardware appears appropriately positioned. No evidence of surgical complicating feature. Electronically Signed   By: Bary Richard M.D.   On: 03/18/2017 11:41   Dg Femur Min 2 Views Right  Result Date: 03/17/2017 CLINICAL DATA:  Status post fall at home, with right hip pain and deformity. Found on floor. Initial encounter. EXAM: RIGHT FEMUR 2 VIEWS COMPARISON:  None. FINDINGS: There is a comminuted right femoral intertrochanteric fracture, with superior displacement of the distal femur, and displaced lesser trochanteric fragments. Surrounding soft tissue swelling is noted. No additional fractures are seen. The distal femur appears intact. No knee joint effusion is identified. Minimal cortical irregularity is noted at the patellofemoral compartment. The right femoral head remains seated at the acetabulum. The visualized bowel gas pattern is grossly unremarkable. IMPRESSION: Comminuted right femoral intertrochanteric fracture, with superior displacement of the distal femur, and displaced lesser trochanteric fragments. Electronically Signed   By: Roanna Raider M.D.   On: 03/17/2017 19:07     ASSESSMENT AND PLAN:   83 year old male with past medical history of dementia, BPH who presented to the hospital for a fall and noted to have a right hip fracture.  1. Status post fall and right hip fracture-patient is status post right hip pinning today. -Continue pain control with IV morphine, oral Vicodin. Physical therapy as tolerated, further care as per orthopedics.  2. BPH-no evidence of urinary retention. -Patient currently is not on Flomax.  3. HTN - BP stable and will monitor.  Not on any meds.   4. CKD Stage III - Cr. At baseline.  No acute issue.    All the records are reviewed and case discussed with Care Management/Social Worker. Management plans discussed with the patient, family and they are in agreement.  CODE STATUS: Full code  DVT Prophylaxis: Hep. SQ  TOTAL TIME TAKING CARE  OF THIS PATIENT: 30 minutes.   POSSIBLE D/C IN 1-2  DAYS, DEPENDING ON CLINICAL CONDITION.   Houston Siren M.D on 03/18/2017 at 1:16 PM  Between 7am to 6pm - Pager - [phone]  After 6pm go to [email or web address] - Social research officer, government  [employer] [facility]  Office  [phone]  CC: Primary care physician; [facility], Madaline Guthrie, MD

## 2017-03-18 NOTE — Progress Notes (Signed)
Condom cath applied and pt had out put of 10ml opt pulled off tugging and pulling at cathther  And had unmeasured output on pad

## 2017-03-18 NOTE — Anesthesia Procedure Notes (Signed)
Procedure Name: LMA Insertion Date/Time: 03/18/2017 10:19 AM Performed by: Edyth Gunnels Pre-anesthesia Checklist: Patient identified, Emergency Drugs available, Suction available, Timeout performed and Patient being monitored Patient Re-evaluated:Patient Re-evaluated prior to inductionOxygen Delivery Method: Circle system utilized Preoxygenation: Pre-oxygenation with 100% oxygen Intubation Type: IV induction Ventilation: Mask ventilation without difficulty LMA: LMA inserted Tube type: Oral Number of attempts: 1 Placement Confirmation: positive ETCO2 and breath sounds checked- equal and bilateral Tube secured with: Tape Dental Injury: Teeth and Oropharynx as per pre-operative assessment

## 2017-03-18 NOTE — Consult Note (Signed)
[facility] CLINIC CARDIOLOGY [facility]  CARDIOLOGY CONSULT NOTE  Patient ID: Isaiah Mckenzie MRN: [id number] DOB/AGE: June 27, 1927 83 y.o.  Admit date: 03/17/2017 Referring Physician Dr. Joice Lofts Primary Physician Dr. Maryjane Hurter Primary Cardiologist   Reason for Consultation preop eval  HPI: 83 yo male with history of hypertension, dementia with no prior cardiac history who was admitted after an apparent mechanical fall suffering a right femoral fracture. Difficult historian. History obtained by phone from patients son and by outpatient chart. No prior cardiac history. Blood pressure previously controlled with ace I but recently on no meds. No evidence of ischemia on ekg. CXR unremarkable.   Review of Systems  Constitutional: Negative.   HENT: Negative.   Eyes: Negative.   Respiratory: Negative.   Cardiovascular: Negative.   Gastrointestinal: Negative.   Genitourinary: Negative.   Musculoskeletal: Positive for falls and joint pain.  Skin: Negative.   Neurological: Negative.   Endo/Heme/Allergies: Negative.   Psychiatric/Behavioral: Positive for memory loss.    Past Medical History:  Diagnosis Date  . Hypertension   . Hyperthyroidism   . Kidney stone   . Memory loss     Family History  Problem Relation Age of Onset  . Breast cancer Sister   . Migraines Maternal Grandmother     2  . Heart attack Father   . Pneumonia Mother   . Kidney disease Neg Hx   . Prostate cancer Neg Hx     Social History   Social History  . Marital status: Widowed    Spouse name: N/A  . Number of children: N/A  . Years of education: N/A   Occupational History  . Not on file.   Social History Main Topics  . Smoking status: Never Smoker  . Smokeless tobacco: Current User  . Alcohol use No  . Drug use: No  . Sexual activity: Not on file   Other Topics Concern  . Not on file   Social History Narrative  . No narrative on file    Past Surgical History:  Procedure  Laterality Date  . EYE SURGERY  2014  . HERNIA REPAIR  2014     Prescriptions Prior to Admission  Medication Sig Dispense Refill Last Dose  . aspirin EC 81 MG tablet Take 1 tablet by mouth daily. Reported on 02/19/2016   03/16/2017 at am  . tamsulosin (FLOMAX) 0.4 MG CAPS capsule Take 1 capsule (0.4 mg total) by mouth daily. (Patient not taking: Reported on 03/17/2017) 30 capsule 0 Not Taking at Unknown time    Physical Exam: Blood pressure (!) 151/80, pulse 93, temperature 98.1 F (36.7 C), temperature source Axillary, resp. rate 18, height  (1.676 m), weight 53.4 kg (117 lb 11.2 oz), SpO2 100 %.   Wt Readings from Last 1 Encounters:  03/17/17 53.4 kg (117 lb 11.2 oz)     General appearance: slowed mentation Head: Normocephalic, without obvious abnormality, atraumatic Resp: clear to auscultation bilaterally Cardio: regular rate and rhythm GI: soft, non-tender; bowel sounds normal; no masses,  no organomegaly Extremities: edema no edema Pulses: 2+ and symmetric Neurologic: Mental status: alertness: slowed mentation, orientation: person  Labs:   Lab Results  Component Value Date   WBC 6.3 03/17/2017   HGB 14.7 03/17/2017   HCT 41.9 03/17/2017   MCV 90.7 03/17/2017   PLT 145 (L) 03/17/2017    Recent Labs Lab 03/17/17 1813  NA 137  K 4.0  CL 104  CO2 29  BUN 11  CREATININE 1.25*  CALCIUM 9.2  GLUCOSE 121*   Lab Results  Component Value Date   TROPONINI <0.03 03/17/2017      Radiology: cxr showed no acute cardiopulmonary disease.  EKG: nsr with no ischemia  ASSESSMENT AND PLAN:  83 yo male with history of hypertension in the past b ut no cardiac disease as well as dementia who was admitted after suffering a comminuted right femoral intertrochanteric fracture which will require surgical correction. Asked to see regarding surgical risk from cardiac standpoint. Pt is a very difficult historian and history is obtained from outpatient chart and patients son by  telephone. EKG shows nsr with no ischemia or arrhythmia. Had been on ace I in the past which was held due to adequate blood pressure. Would proceed with surgery from cardiac standpoint. Pt is moderate risk based on age. He is well optimized from cardiac standpoint. Would proceed with surgery without any further cardiac work up. Will follow post op as needed.  Signed: Dalia Heading MD, [facility] 03/18/2017, 8:44 AM

## 2017-03-18 NOTE — Anesthesia Post-op Follow-up Note (Cosign Needed)
Anesthesia QCDR form completed.        

## 2017-03-18 NOTE — Op Note (Signed)
03/17/2017 - 03/18/2017  11:35 AM  Patient:   Isaiah Mckenzie  Pre-Op Diagnosis:   Closed displaced 4 part intertrochanteric fracture, right hip.  Post-Op Diagnosis:   Same.  Procedure:   Reduction and internal fixation of placed intertrochanteric right hip fracture with Biomet Affixis TFN nail.  Surgeon:   Maryagnes Amos, MD  Assistant:   None  Anesthesia:   General LMA  Findings:   As above  Complications:   None  EBL:   75 cc  Fluids:   600 cc crystalloid  UOP:   None  TT:   None  Drains:   None  Closure:   Staples  Implants:   Biomet Affixis 11 x 380 mm TFN with a 95 mm lag screw and a 38 mm distal interlocking screw  Brief Clinical Note:   The patient is an 83 year old male who sustained the above-noted injury late yesterday afternoon when he lost his balance and fell upon getting up from the couch in his home. The patient has been cleared medically and presents at this time for reduction and internal fixation of the displaced intertrochanteric right hip fracture.  Procedure:   The patient was brought into the operating room. After adequate general laryngeal mask anesthesia was obtained, the patient was lain in the supine position on the fracture table. The uninjured leg was placed in a flexed and abducted position while the injured lower extremity was placed in longitudinal traction. The fracture was reduced using longitudinal traction and internal rotation. The adequacy of reduction was verified fluoroscopically in AP and lateral projections and found to be near anatomic. The lateral aspects of the right hip and thigh were prepped with ChloraPrep solution before being draped sterilely. Preoperative antibiotics were administered. A timeout was performed to verify the appropriate surgical site. The greater trochanter was identified fluoroscopically and an approximately 3 cm incision made about 2-3 fingerbreadths above the tip of the greater trochanter. The incision was  carried down through the subcutaneous tissues to expose the gluteal fascia. This was split the length of the incision, providing access to the tip of the trochanter. Under fluoroscopic guidance, a guidewire was drilled through the tip of the trochanter into the proximal metaphysis to the level of the lesser trochanter. After verifying its position fluoroscopically in AP and lateral projections, it was overreamed with the initial reamer to the depth of the lesser trochanter. A guidewire was passed down through the femoral canal to the supracondylar region. The adequacy of guidewire position was verified fluoroscopically in AP and lateral projections before the length of the guidewire within the canal was measured and found to be 395 mm. Therefore, a 380 mm length nail was selected. The guidewire was overreamed sequentially using the flexible reamers, beginning with a 10.5 mm reamer and progressing to a 12.5 mm reamer. This provided good cortical chatter. The 11 x 380 mm Biomet Affixis TFN rod was selected and advanced to the appropriate depth, as verified fluoroscopically.   The guide system for the lag screw was positioned and advanced through an approximately 2 cm stab incision over the lateral aspect of the proximal femur. The guidewire was drilled up through the trochanteric femoral nail and into the femoral neck to rest within 5 mm of subchondral bone. After verifying its position in the femoral neck and head in both AP and lateral projections, the guidewire was measured and found to be optimally replicated by a 95 mm lag screw. The guidewire was overreamed to the appropriate  depth before the lag screw was inserted and advanced to the appropriate depth as verified fluoroscopically in AP and lateral projections. The locking screw was advanced, then backed off a quarter turn to set the lag screw. Again the adequacy of hardware position and fracture reduction was verified fluoroscopically in AP and lateral  projections and found to be excellent.  Attention was directed distally. Using the "perfect circle" technique, the leg and fluoroscopy machine were positioned appropriately. An approximate 1.5 cm stab incision was made over the skin at the appropriate point before the drill bit was advanced through the cortex and across the static hole of the nail. The appropriate length of the screw was determined before the 38 mm distal interlocking screw was positioned, then advanced and tightened securely. Again the adequacy of screw position was verified fluoroscopically in AP and lateral projections and found to be excellent.  The wounds were irrigated thoroughly with sterile saline solution before the abductor fascia was reapproximated using #1 Vicryl interrupted sutures. The subcutaneous tissues were closed using 2-0 Vicryl interrupted sutures. The skin was closed using staples. A total of 30 cc of 0.5% Sensorcaine with epinephrine was injected in and around all incisions. Sterile occlusive dressings were applied to all wounds before the patient was transferred back to his/her hospital bed. The patient was then transferred to the recovery room in satisfactory condition after tolerating the procedure well.

## 2017-03-19 LAB — CBC WITH DIFFERENTIAL/PLATELET
BASOS ABS: 0.1 10*3/uL (ref 0–0.1)
Basophils Relative: 1 %
EOS PCT: 1 %
Eosinophils Absolute: 0.1 10*3/uL (ref 0–0.7)
HEMATOCRIT: 29.2 % — AB (ref 40.0–52.0)
Hemoglobin: 10.2 g/dL — ABNORMAL LOW (ref 13.0–18.0)
LYMPHS PCT: 8 %
Lymphs Abs: 0.7 10*3/uL — ABNORMAL LOW (ref 1.0–3.6)
MCH: 31.7 pg (ref 26.0–34.0)
MCHC: 34.9 g/dL (ref 32.0–36.0)
MCV: 90.9 fL (ref 80.0–100.0)
MONO ABS: 0.8 10*3/uL (ref 0.2–1.0)
MONOS PCT: 8 %
NEUTROS ABS: 7.4 10*3/uL — AB (ref 1.4–6.5)
Neutrophils Relative %: 82 %
PLATELETS: 123 10*3/uL — AB (ref 150–440)
RBC: 3.22 MIL/uL — ABNORMAL LOW (ref 4.40–5.90)
RDW: 13.2 % (ref 11.5–14.5)
WBC: 8.9 10*3/uL (ref 3.8–10.6)

## 2017-03-19 LAB — BASIC METABOLIC PANEL
ANION GAP: 6 (ref 5–15)
BUN: 13 mg/dL (ref 6–20)
CALCIUM: 8.3 mg/dL — AB (ref 8.9–10.3)
CO2: 27 mmol/L (ref 22–32)
CREATININE: 1.38 mg/dL — AB (ref 0.61–1.24)
Chloride: 108 mmol/L (ref 101–111)
GFR calc Af Amer: 51 mL/min — ABNORMAL LOW (ref >60)
GFR, EST NON AFRICAN AMERICAN: 44 mL/min — AB (ref >60)
GLUCOSE: 159 mg/dL — AB (ref 65–99)
Potassium: 4.8 mmol/L (ref 3.5–5.1)
Sodium: 141 mmol/L (ref 135–145)

## 2017-03-19 MED ORDER — HALOPERIDOL LACTATE 5 MG/ML IJ SOLN: 2.5000 mg | Freq: Four times a day (QID) | INTRAMUSCULAR | Status: DC | PRN

## 2017-03-19 MED ORDER — MORPHINE SULFATE (PF) 2 MG/ML IV SOLN
2.0000 mg | INTRAVENOUS | Status: DC | PRN
  Administered 2017-03-19: 2 mg via INTRAVENOUS
  Filled 2017-03-19: qty 1

## 2017-03-19 MED ORDER — LABETALOL HCL 5 MG/ML IV SOLN
10.0000 mg | INTRAVENOUS | Status: DC | PRN
  Administered 2017-03-19: 10 mg via INTRAVENOUS
  Filled 2017-03-19 (×2): qty 4

## 2017-03-19 NOTE — Progress Notes (Signed)
PT Cancellation Note  Patient Details Name: Isaiah Mckenzie MRN: [id number] DOB: 09-21-1927   Cancelled Treatment:    Reason Eval/Treat Not Completed: Other (comment)  Spoke with RN and was instructed to hold PT evaluation today due to patient's status. Will check back when patient is appropriate to participate in PT.    Katherina Right Roy Tokarz 03/19/2017, 9:12 AM

## 2017-03-19 NOTE — Clinical Social Work Note (Signed)
CSW received consult for possible placement. CSW is following pending PT recommendations.   Argentina Ponder, MSW, [facility] [phone]

## 2017-03-19 NOTE — Progress Notes (Signed)
[employer] - [place] [facility]   PATIENT NAME: Isaiah Mckenzie    MR#:  [id number]  DATE OF BIRTH:  28-Apr-1927  SUBJECTIVE:    Here with Hip Fracture and s/p Right Intramedullary pinning POD # 1. Very combative and agitated this a.m.   REVIEW OF SYSTEMS:    Review of Systems  Unable to perform ROS: Dementia    Nutrition: Regular Tolerating Diet: No Tolerating PT: Await Eval.      DRUG ALLERGIES:  No Known Allergies  VITALS:  Blood pressure (!) 145/84, pulse 71, temperature 98.6 F (37 C), temperature source Axillary, resp. rate 16, height  (1.676 m), weight 53.4 kg (117 lb 11.2 oz), SpO2 96 %.  PHYSICAL EXAMINATION:   Physical Exam  GENERAL:  83 y.o.-year-old patient lying in bed agitated and combative at times.  EYES: Pupils equal, round, reactive to light. No scleral icterus. Extraocular muscles intact.  HEENT: Head atraumatic, normocephalic. Oropharynx and nasopharynx clear.  NECK:  Supple, no jugular venous distention. No thyroid enlargement, no tenderness.  LUNGS: Normal breath sounds bilaterally, no wheezing, rales, rhonchi. No use of accessory muscles of respiration.  CARDIOVASCULAR: S1, S2 normal. No murmurs, rubs, or gallops.  ABDOMEN: Soft, nontender, nondistended. Bowel sounds present. No organomegaly or mass.  EXTREMITIES: No cyanosis, clubbing or edema b/l.   Right Hip dressing from surgery.  NEUROLOGIC: Cranial nerves II through XII are intact. No focal Motor or sensory deficits b/l.  Combative Agitated.  PSYCHIATRIC: The patient is alert and oriented x 1.   SKIN: No obvious rash, lesion, or ulcer.    LABORATORY PANEL:   CBC  Recent Labs Lab 03/19/17 0330  WBC 8.9  HGB 10.2*  HCT 29.2*  PLT 123*   ------------------------------------------------------------------------------------------------------------------  Chemistries   Recent Labs Lab 03/19/17 0330  NA 141  K 4.8  CL 108  CO2 27  GLUCOSE 159*  BUN 13   CREATININE 1.38*  CALCIUM 8.3*   ------------------------------------------------------------------------------------------------------------------  Cardiac Enzymes  Recent Labs Lab 03/17/17 1816  TROPONINI <0.03   ------------------------------------------------------------------------------------------------------------------  RADIOLOGY:  Dg Pelvis 1-2 Views  Result Date: 03/17/2017 CLINICAL DATA:  Acute onset of right hip pain and deformity after fall at home. Found on floor. Initial encounter. EXAM: PELVIS - 1-2 VIEW COMPARISON:  CT of the abdomen and pelvis from 01/26/2016 FINDINGS: There is a comminuted right femoral intertrochanteric fracture, with displaced lesser trochanteric fragments. There is superior displacement of the distal femur. The right femoral head remains seated at the acetabulum. Surrounding soft tissue swelling is noted. The left hip joint is unremarkable in appearance. The sacroiliac joints are grossly unremarkable. The visualized bowel gas pattern is unremarkable. A mesh is seen at the right hemipelvis. IMPRESSION: Comminuted right femoral intertrochanteric fracture, with displaced lesser trochanteric fragments. Superior displacement of the distal femur. Electronically Signed   By: Roanna Raider M.D.   On: 03/17/2017 19:01   Ct Head Wo Contrast  Result Date: 03/17/2017 CLINICAL DATA:  Fall. EXAM: CT HEAD WITHOUT CONTRAST TECHNIQUE: Contiguous axial images were obtained from the base of the skull through the vertex without intravenous contrast. COMPARISON:  01/17/2013. FINDINGS: Brain: There is no evidence for acute hemorrhage, hydrocephalus, mass lesion, or abnormal extra-axial fluid collection. No definite CT evidence for acute infarction. Diffuse loss of parenchymal volume is consistent with atrophy. Patchy low attenuation in the deep hemispheric and periventricular white matter is nonspecific, but likely reflects chronic microvascular ischemic demyelination.  Lacunar infarcts noted in the basal ganglia bilaterally. Vascular:  No hyperdense vessel or unexpected calcification. Skull: No evidence for fracture. No worrisome lytic or sclerotic lesion. Sinuses/Orbits: The visualized paranasal sinuses and mastoid air cells are clear. Visualized portions of the globes and intraorbital fat are unremarkable. Other: None. IMPRESSION: Stable.  No acute intracranial abnormality. Atrophy with chronic small vessel white matter ischemic disease. Electronically Signed   By: Kennith Center M.D.   On: 03/17/2017 19:44   Dg Chest Portable 1 View  Result Date: 03/17/2017 CLINICAL DATA:  Status post fall, with concern for chest injury. Initial encounter. EXAM: PORTABLE CHEST 1 VIEW COMPARISON:  Chest radiograph performed 09/30/2014 FINDINGS: The lungs are well-aerated and clear. There is no evidence of focal opacification, pleural effusion or pneumothorax. The cardiomediastinal silhouette is within normal limits. No acute osseous abnormalities are seen. IMPRESSION: No acute cardiopulmonary process seen. No displaced rib fractures identified. Electronically Signed   By: Roanna Raider M.D.   On: 03/17/2017 19:08   Dg Hip Operative Unilat W Or W/o Pelvis Right  Result Date: 03/18/2017 CLINICAL DATA:  Right hip fracture EXAM: OPERATIVE RIGHT HIP (WITH PELVIS IF PERFORMED)  VIEWS TECHNIQUE: Fluoroscopic spot image(s) were submitted for interpretation post-operatively. COMPARISON:  None. FINDINGS: Four intraoperative fluoroscopic spot images are provided showing intra Mary early rod with compression screw fixation of the right femur. Where appears intact and appropriately positioned. Osseous alignment is anatomic. No evidence of surgical complicating feature. Fluoroscopy provided for 1 minutes 28 seconds. IMPRESSION: Intraoperative fluoroscopic spot images showing rod and screw fixation of the proximal right femur. Hardware appears appropriately positioned. No evidence of surgical  complicating feature. Electronically Signed   By: Bary Richard M.D.   On: 03/18/2017 11:41   Dg Femur Min 2 Views Right  Result Date: 03/17/2017 CLINICAL DATA:  Status post fall at home, with right hip pain and deformity. Found on floor. Initial encounter. EXAM: RIGHT FEMUR 2 VIEWS COMPARISON:  None. FINDINGS: There is a comminuted right femoral intertrochanteric fracture, with superior displacement of the distal femur, and displaced lesser trochanteric fragments. Surrounding soft tissue swelling is noted. No additional fractures are seen. The distal femur appears intact. No knee joint effusion is identified. Minimal cortical irregularity is noted at the patellofemoral compartment. The right femoral head remains seated at the acetabulum. The visualized bowel gas pattern is grossly unremarkable. IMPRESSION: Comminuted right femoral intertrochanteric fracture, with superior displacement of the distal femur, and displaced lesser trochanteric fragments. Electronically Signed   By: Roanna Raider M.D.   On: 03/17/2017 19:07     ASSESSMENT AND PLAN:   83 year old male with past medical history of dementia, BPH who presented to the hospital for a fall and noted to have a right hip fracture.  1. Status post fall and right hip fracture-patient is status post right hip pinning POD # 1. -Continue pain control with IV morphine, oral Vicodin. Physical therapy as tolerated, further care as per orthopedics.  2. BPH-no evidence of urinary retention. -Patient currently is not on Flomax.  3. HTN - BP stable and will monitor.  Not on any meds.   4. CKD Stage III - Cr. At baseline.  No acute issue.    5. Advanced Dementia with agitation - PRN haldol for agitation.  Avoid Benzo's.   All the records are reviewed and case discussed with Care Management/Social Worker. Management plans discussed with the patient, family and they are in agreement.  CODE STATUS: Full code  DVT Prophylaxis: Hep. SQ  TOTAL TIME  TAKING CARE OF THIS PATIENT: 25  minutes.   POSSIBLE D/C IN 1-2 DAYS, DEPENDING ON CLINICAL CONDITION.   Houston Siren M.D on 03/19/2017 at 12:23 PM  Between 7am to 6pm - Pager - [phone]  After 6pm go to [email or web address] - Social research officer, government  [employer] [facility]  Office  [phone]  CC: Primary care physician; [facility], Madaline Guthrie, MD

## 2017-03-19 NOTE — Progress Notes (Signed)
Pt is combative when RN or NT Turns pt. Pt refuses to dangle at this time.

## 2017-03-19 NOTE — Progress Notes (Signed)
  Subjective: 1 Day Post-Op Procedure(s) (LRB): INTRAMEDULLARY (IM) NAIL INTERTROCHANTRIC (Right) Patient reports pain as 5 on 0-10 scale.   Patient seen in rounds with Dr. Joice Lofts. Patient is well, and has had no acute complaints or problems.  Very combative in the evening, but resting this morning. Plan is to go Skilled nursing facility after hospital stay. Negative for chest pain and shortness of breath Fever: no Gastrointestinal:negative for nausea and vomiting  Objective: Vital signs in last 24 hours: Temp:  [97.8 F (36.6 C)-100 F (37.8 C)] 98.9 F (37.2 C) (04/22 0359) Pulse Rate:  [77-132] 101 (04/22 0359) Resp:  [11-20] 15 (04/22 0359) BP: (113-161)/(53-82) 127/59 (04/22 0359) SpO2:  [98 %-100 %] 98 % (04/22 0359) FiO2 (%):  [32 %] 32 % (04/21 1525)  Intake/Output from previous day:  Intake/Output Summary (Last 24 hours) at 03/19/17 0653 Last data filed at 03/19/17 0645  Gross per 24 hour  Intake              800 ml  Output               75 ml  Net              725 ml    Intake/Output this shift: Total I/O In: 100 [IV Piggyback:100] Out: 0   Labs:  Recent Labs  03/17/17 1813 03/19/17 0330  HGB 14.7 10.2*    Recent Labs  03/17/17 1813 03/19/17 0330  WBC 6.3 8.9  RBC 4.62 3.22*  HCT 41.9 29.2*  PLT 145* 123*    Recent Labs  03/17/17 1813 03/19/17 0330  NA 137 141  K 4.0 4.8  CL 104 108  CO2 29 27  BUN 11 13  CREATININE 1.25* 1.38*  GLUCOSE 121* 159*  CALCIUM 9.2 8.3*   No results for input(s): LABPT, INR in the last 72 hours.   EXAM General - Patient is Confused and Hallucinating Extremity - Intact pulses distally No cellulitis present Compartment soft Dressing/Incision - clean, dry, no drainage Motor Function - intact, moving foot and toes well on exam.   Past Medical History:  Diagnosis Date  . Hypertension   . Hyperthyroidism   . Kidney stone   . Memory loss     Assessment/Plan: 1 Day Post-Op Procedure(s)  (LRB): INTRAMEDULLARY (IM) NAIL INTERTROCHANTRIC (Right) Active Problems:   Intertrochanteric fracture of right hip (HCC)   Pressure injury of skin  Estimated body mass index is 19 kg/m as calculated from the following:   Height as of this encounter:  (1.676 m).   Weight as of this encounter: 53.4 kg (117 lb 11.2 oz). Advance diet Up with therapy D/C IV fluids Discharge to SNF when stable with Medicine  DVT Prophylaxis - Lovenox, Foot Pumps and TED hose Weight-Bearing as tolerated to Right leg  Dedra Skeens, PA-C Orthopaedic Surgery 03/19/2017, 6:53 AM

## 2017-03-20 NOTE — Clinical Social Work Placement (Signed)
   CLINICAL SOCIAL WORK PLACEMENT  NOTE  Date:  03/20/2017  Patient Details  Name: Isaiah Mckenzie MRN: [id number] Date of Birth: March 07, 1927  Clinical Social Work is seeking post-discharge placement for this patient at the Skilled  Nursing Facility level of care (*CSW will initial, date and re-position this form in  chart as items are completed):  Yes   Patient/family provided with [facility] Clinical Social Work Department's list of facilities offering this level of care within the geographic area requested by the patient (or if unable, by the patient's family).  Yes   Patient/family informed of their freedom to choose among providers that offer the needed level of care, that participate in Medicare, Medicaid or managed care program needed by the patient, have an available bed and are willing to accept the patient.  Yes   Patient/family informed of [facility]'s ownership interest in [facility] and [facility], as well as of the fact that they are under no obligation to receive care at these facilities.  PASRR submitted to EDS on 03/20/17     PASRR number received on 03/20/17     Existing PASRR number confirmed on       FL2 transmitted to all facilities in geographic area requested by pt/family on 03/20/17     FL2 transmitted to all facilities within larger geographic area on       Patient informed that his/her managed care company has contracts with or will negotiate with certain facilities, including the following:        Yes   Patient/family informed of bed offers received.  Patient chooses bed at  [facility] )     Physician recommends and patient chooses bed at      Patient to be transferred to   on  .  Patient to be transferred to facility by       Patient family notified on   of transfer.  Name of family member notified:        PHYSICIAN       Additional Comment:    _______________________________________________ Jamari Moten, Darleen Crocker,  LCSW 03/20/2017, 4:28 PM

## 2017-03-20 NOTE — Progress Notes (Signed)
Patient refusing vital sign assessments and oral care at this time.  Harvie Heck, RN

## 2017-03-20 NOTE — Discharge Instructions (Signed)
Upon discharge from the hospital, continue with Lovenox  daily x14 days. Staple can be removed on 03/31/17. Follow-up with either Dr. Joice Lofts or Valeria Batman, PA-C with [facility] orthopaedics in 6 weeks for x-rays.

## 2017-03-20 NOTE — NC FL2 (Signed)
[place] MEDICAID FL2 LEVEL OF CARE SCREENING TOOL     IDENTIFICATION  Patient Name: Isaiah Mckenzie Birthdate: 1926/12/15 Sex: male Admission Date (Current Location): 03/17/2017  [place] and [place] Number:  Chiropodist and Address:  [facility], [address] [place], [place] [address]      Provider Number: [id number]  Attending Physician Name and Address:  Houston Siren, MD  Relative Name and Phone Number:       Current Level of Care: Hospital Recommended Level of Care: Skilled Nursing Facility Prior Approval Number:    Date Approved/Denied:   PASRR Number:  [id number])  Discharge Plan: SNF    Current Diagnoses: Patient Active Problem List   Diagnosis Date Noted  . Pressure injury of skin 03/18/2017  . Intertrochanteric fracture of right hip (HCC) 03/17/2017  . ARF (acute renal failure) (HCC) 01/26/2016    Orientation RESPIRATION BLADDER Height & Weight     Self  Normal Incontinent Weight: 117 lb 11.2 oz (53.4 kg) Height:   (167.6 cm)  BEHAVIORAL SYMPTOMS/MOOD NEUROLOGICAL BOWEL NUTRITION STATUS   (none)  (none) Continent Diet (Diet: Heart Healthy )  AMBULATORY STATUS COMMUNICATION OF NEEDS Skin   Extensive Assist Verbally Surgical wounds, PU Stage and Appropriate Care (Pressuer Ulcer Stage 1 on Sacrum. )                       Personal Care Assistance Level of Assistance  Bathing, Feeding, Dressing Bathing Assistance: Limited assistance Feeding assistance: Independent Dressing Assistance: Limited assistance     Functional Limitations Info  Sight, Hearing, Speech Sight Info: Adequate Hearing Info: Adequate Speech Info: Adequate    SPECIAL CARE FACTORS FREQUENCY  PT (By licensed PT), OT (By licensed OT)     PT Frequency:  (5) OT Frequency:  (5)            Contractures      Additional Factors Info  Code Status, Allergies Code Status Info:  (Full Code. ) Allergies Info:  (No Known  Allergies. )           Current Medications (03/20/2017):  This is the current hospital active medication list Current Facility-Administered Medications  Medication Dose Route Frequency Provider Last Rate Last Dose  . acetaminophen (TYLENOL) tablet 650 mg  650 mg Oral Q6H PRN Christena Flake, MD       Or  . acetaminophen (TYLENOL) suppository 650 mg  650 mg Rectal Q6H PRN Christena Flake, MD      . bisacodyl (DULCOLAX) suppository 10 mg  10 mg Rectal Daily PRN Christena Flake, MD      . dextrose 5 % and 0.9 % NaCl with KCl 20 mEq/L infusion   Intravenous Continuous Christena Flake, MD 20 mL/hr at 03/19/17 2130    . diphenhydrAMINE (BENADRYL) 12.5 MG/5ML elixir 12.5-25 mg  12.5-25 mg Oral Q4H PRN Christena Flake, MD      . docusate sodium (COLACE) capsule 100 mg  100 mg Oral BID Christena Flake, MD   100 mg at 03/18/17 1642  . enoxaparin (LOVENOX) injection 30 mg  30 mg Subcutaneous Q24H Christena Flake, MD   30 mg at 03/19/17 0858  . haloperidol lactate (HALDOL) injection 2.5 mg  2.5 mg Intravenous Q6H PRN Houston Siren, MD      . labetalol (NORMODYNE,TRANDATE) injection 10 mg  10 mg Intravenous Q2H PRN Oralia Manis, MD   10 mg at 03/19/17  2248  . magnesium hydroxide (MILK OF MAGNESIA) suspension 30 mL  30 mL Oral Daily PRN Christena Flake, MD      . MEDLINE mouth rinse  15 mL Mouth Rinse BID Christena Flake, MD   Stopped at 03/17/17 2338  . metoCLOPramide (REGLAN) tablet 5-10 mg  5-10 mg Oral Q8H PRN Christena Flake, MD       Or  . metoCLOPramide (REGLAN) injection 5-10 mg  5-10 mg Intravenous Q8H PRN Christena Flake, MD      . morphine 2 MG/ML injection 2 mg  2 mg Intravenous Q4H PRN Oralia Manis, MD   2 mg at 03/19/17 2249  . ondansetron (ZOFRAN) tablet 4 mg  4 mg Oral Q6H PRN Christena Flake, MD       Or  . ondansetron [facility]) injection 4 mg  4 mg Intravenous Q6H PRN Christena Flake, MD      . oxyCODONE (Oxy IR/ROXICODONE) immediate release tablet 5-10 mg  5-10 mg Oral Q3H PRN Christena Flake, MD      . sodium phosphate  (FLEET) 7-19 GM/118ML enema 1 enema  1 enema Rectal Once PRN Christena Flake, MD         Discharge Medications: Please see discharge summary for a list of discharge medications.  Relevant Imaging Results:  Relevant Lab Results:   Additional Information  (SSN: [id number])  Kyleigh Nannini, Darleen Crocker, LCSW

## 2017-03-20 NOTE — Evaluation (Signed)
Physical Therapy Evaluation Patient Details Name: Isaiah Mckenzie MRN: [id number] DOB: 1927/09/28 Today's Date: 03/20/2017   History of Present Illness  Pt presented to the ED after a fall at home resulting in an intertrochanteric fracture of his R hip s/p R intermedullary nail pinning 03/18/17. Pt has PMH of HTN, hyperthyroidism, kidney stone, and advanced dementia.   Clinical Impression  Pt was resting in bed upon entry with his son and daughter at bedside. Pt's family agreeable to PT evaluation. Pt only responsive to being called [name] and would arouse intermittently in bed. Pt more aroused with attempting to sit EOB. Pt is very active with his hands and required total assist of 3 to sit EOB after first attempt. Pt required total assist of 3 for bed mobility and min-mod assist of 2 for transfers. Pt limited by visual signs of fatigue and pain. Pt will benefit from skilled PT during admission to increase functional mobility, activity tolerance, and ambulation distance. Recommend d/c to SNF for deficits mentioned above and limited assistance at home.      Follow Up Recommendations SNF    Equipment Recommendations  Rolling walker with 5" wheels;3in1 (PT) (Pt has RW and bed side commode)    Recommendations for Other Services       Precautions / Restrictions Precautions Precautions: Fall Restrictions Weight Bearing Restrictions: Yes RLE Weight Bearing: Weight bearing as tolerated      Mobility  Bed Mobility Overal bed mobility: Needs Assistance Bed Mobility: Supine to Sit;Sit to Supine     Supine to sit: Total assist (+3 for trunk, legs, and hands (pt is very active with his hands and resists)) Sit to supine: Total assist (+3 for trunk, legs, and hands (pt is very active with his hands and resists))   General bed mobility comments: Pt pushing and pulling with his hands during mobility, but stopped once he was able to place hands on bed when sitting EOB to support  himself  Transfers Overall transfer level: Needs assistance Equipment used: Rolling walker (2 wheeled) Transfers: Sit to/from Stand Sit to Stand: Min assist;Mod assist;+2 physical assistance         General transfer comment: Pt required min assist on the L and mod assist on the right to stand; pt did not put any weight through his R LE in standing and leaned toward the R; pt was very unsteady and seemed to be hopping on his L foot to regain his balance. Pt required max v/c's and hand over hand assistance to put his hands on the RW before standing, but initiated standing with v/c's and held onto the RW w/o v/c's in standing; Pt needed to sit back down d/t visual signs of fatigue and pai  Ambulation/Gait Ambulation/Gait assistance:  (Not assessed d/t pt fatigue and pain)              Stairs            Wheelchair Mobility    Modified Rankin (Stroke Patients Only)       Balance Overall balance assessment: Needs assistance Sitting-balance support: Bilateral upper extremity supported;Feet supported Sitting balance-Leahy Scale: Poor Sitting balance - Comments: Pt required min assist in sitting, but did lean posteriorly to lay back down x2 Postural control: Posterior lean Standing balance support: Bilateral upper extremity supported Standing balance-Leahy Scale: Poor Standing balance comment: see general transfer comments  Pertinent Vitals/Pain Pain Assessment: Faces Faces Pain Scale: Hurts whole lot Pain Descriptors / Indicators: Grimacing;Operative site guarding Pain Intervention(s): Limited activity within patient's tolerance;Monitored during session;Repositioned  Vitals: Unable to obtain vitals d/t pt confusion and resisting     Home Living Family/patient expects to be discharged to:: Private residence Living Arrangements: Children Available Help at Discharge: Family;Available 24 hours/day Type of Home: House Home Access:  Level entry     Home Layout: One level Home Equipment: Walker - 2 wheels;Bedside commode Additional Comments: Pt's son reports that the pt uses the bathroom intermittently, but he keeps the bed side commode in the room incase the pt needs to use it instead.    Prior Function Level of Independence: Needs assistance   Gait / Transfers Assistance Needed: Pt's son reports that he requires supervision with use of RW to ambulate around the home, and intermittently does not use the RW  ADL's / Homemaking Assistance Needed: Pt's son assists with all ADL's and IADL's, but does report that the pt does what he can to help  Comments: Pt's son and daughter report that the pt has had 3-4 falls within the past year, but this is the first fall to result in serious injury.     Hand Dominance        Extremity/Trunk Assessment   Upper Extremity Assessment Upper Extremity Assessment: Difficult to assess due to impaired cognition;Generalized weakness    Lower Extremity Assessment Lower Extremity Assessment: Difficult to assess due to impaired cognition;Generalized weakness    Cervical / Trunk Assessment Cervical / Trunk Assessment: Kyphotic  Communication   Communication: HOH;Expressive difficulties  Cognition Arousal/Alertness: Lethargic (pt not easily aroused, but became more alert when assisted with sitting up) Behavior During Therapy: Impulsive Overall Cognitive Status: History of cognitive impairments - at baseline                                 General Comments: Pt intermittently follows v/c's and responds to hand over hand assistance; Pt prefers to be called [name], per his son and daughter      General Comments      Exercises     Assessment/Plan    PT Assessment Patient needs continued PT services  PT Problem List Decreased strength;Decreased mobility;Decreased safety awareness;Decreased range of motion;Decreased knowledge of precautions;Decreased activity  tolerance;Decreased balance;Decreased knowledge of use of DME;Pain       PT Treatment Interventions DME instruction;Therapeutic activities;Gait training;Therapeutic exercise;Patient/family education;Balance training;Functional mobility training    PT Goals (Current goals can be found in the Care Plan section)  Acute Rehab PT Goals Patient Stated Goal: to get back to what he was doing before at home. PT Goal Formulation: With family Time For Goal Achievement: 04/03/17 Potential to Achieve Goals: Poor    Frequency BID   Barriers to discharge        Co-evaluation               End of Session Equipment Utilized During Treatment: Gait belt Activity Tolerance: Patient limited by fatigue;Patient limited by pain Patient left: in bed;with call bell/phone within reach;with bed alarm set;with family/visitor present;with SCD's reapplied;with restraints reapplied (with son and daughter at bedside; with glove mitts applied; with LE's elevated by a pillow)   PT Visit Diagnosis: Unsteadiness on feet (R26.81);Other abnormalities of gait and mobility (R26.89);Muscle weakness (generalized) (M62.81);History of falling (Z91.81);Pain Pain - Right/Left: Right Pain - part of body: Hip  Time: 9629-5284 PT Time Calculation (min) (ACUTE ONLY): 25 min   Charges:         PT G Codes:        Ronnesha Mester, SPT 03/20/2017, 1:27 PM

## 2017-03-20 NOTE — Progress Notes (Signed)
RN was able to administer pain medication crushed in applesauce, patient was cooperative. He ate all of his applesauce and mashed potatoes mixed with some broccoli. This is the first meal the patient allowed nursing staff to feed him today. Patient drank his tea and some milk. RN offered him a ensure, and he drank some. RN will put in dietary consult because patient is not eating as much as he should. May need supplement.   Harvie Heck, RN

## 2017-03-20 NOTE — Progress Notes (Signed)
[facility] - [place] [facility]   PATIENT NAME: Isaiah Mckenzie    MR#:  [id number]  DATE OF BIRTH:  [birth year]  SUBJECTIVE:    Here with Hip Fracture and s/p Right Intramedullary pinning POD # 2. Remains Lethargic/Encephalopathic.    REVIEW OF SYSTEMS:    Review of Systems  Unable to perform ROS: Dementia    Nutrition: Regular Tolerating Diet: No Tolerating PT: Await Eval.      DRUG ALLERGIES:  No Known Allergies  VITALS:  Blood pressure 131/67, pulse (!) 121, temperature 98.4 F (36.9 C), temperature source Oral, resp. rate 16, height  (1.676 m), weight 53.4 kg (117 lb 11.2 oz), SpO2 95 %.  PHYSICAL EXAMINATION:   Physical Exam  GENERAL:  83 y.o.-year-old patient lying in bed agitated and combative at times.  EYES: Pupils equal, round, reactive to light. No scleral icterus. Extraocular muscles intact.  HEENT: Head atraumatic, normocephalic. Oropharynx and nasopharynx clear.  NECK:  Supple, no jugular venous distention. No thyroid enlargement, no tenderness.  LUNGS: Normal breath sounds bilaterally, no wheezing, rales, rhonchi. No use of accessory muscles of respiration.  CARDIOVASCULAR: S1, S2 normal. No murmurs, rubs, or gallops.  ABDOMEN: Soft, nontender, nondistended. Bowel sounds present. No organomegaly or mass.  EXTREMITIES: No cyanosis, clubbing or edema b/l.   Right Hip dressing from surgery.  NEUROLOGIC: Cranial nerves II through XII are intact. No focal Motor or sensory deficits b/l.  Combative Agitated.  PSYCHIATRIC: The patient is alert and oriented x 1.   SKIN: No obvious rash, lesion, or ulcer.    LABORATORY PANEL:   CBC  Recent Labs Lab 03/19/17 0330  WBC 8.9  HGB 10.2*  HCT 29.2*  PLT 123*   ------------------------------------------------------------------------------------------------------------------  Chemistries   Recent Labs Lab 03/19/17 0330  NA 141  K 4.8  CL 108  CO2 27  GLUCOSE 159*  BUN 13   CREATININE 1.38*  CALCIUM 8.3*   ------------------------------------------------------------------------------------------------------------------  Cardiac Enzymes  Recent Labs Lab 03/17/17 1816  TROPONINI <0.03   ------------------------------------------------------------------------------------------------------------------  RADIOLOGY:  No results found.   ASSESSMENT AND PLAN:   83 year old male with past medical history of dementia, BPH who presented to the hospital for a fall and noted to have a right hip fracture.  1. Status post fall and right hip fracture-patient is status post right hip pinning POD # 2. -Continue pain control with IV morphine, oral Vicodin.  - not working with PT due to AMS/encephalopathy.   2. BPH-no evidence of urinary retention.  3. HTN - BP stable and will monitor.  Not on any meds.   4. CKD Stage III - Cr. At baseline.  No acute issue.    5. Advanced Dementia with agitation - cont. PRN haldol for agitation.  - Avoid Benzo's.  Remains encephalopathic and likely due to anesthesia and recent surgery and will cont. To monitor.  Keep awake during day and sleep at night.    Discussed plan of care with pt's family at bedside.   All the records are reviewed and case discussed with Care Management/Social Worker. Management plans discussed with the patient, family and they are in agreement.  CODE STATUS: Full code  DVT Prophylaxis: Hep. SQ  TOTAL TIME TAKING CARE OF THIS PATIENT: 25 minutes.   POSSIBLE D/C IN 1-2 DAYS, DEPENDING ON CLINICAL CONDITION.   Houston Siren M.D on 03/20/2017 at 2:55 PM  Between 7am to 6pm - Pager - [phone]  After 6pm go to [email or web address] - password [email or web address]  [facility]  [employer] [place] [facility]  Office  [phone]  CC: Primary care physician; [facility], Madaline Guthrie, MD

## 2017-03-20 NOTE — Progress Notes (Signed)
  Subjective: 2 Days Post-Op Procedure(s) (LRB): INTRAMEDULLARY (IM) NAIL INTERTROCHANTRIC (Right) Patient did not verbalize a pain score to me this AM. Patient is well, and has had no acute complaints or problems.  Placed on medications for being combative. Plan is to go Skilled nursing facility after hospital stay. Negative for chest pain and shortness of breath Fever: no Gastrointestinal:negative for nausea and vomiting  Objective: Vital signs in last 24 hours: Temp:  [97.4 F (36.3 C)-99.7 F (37.6 C)] 97.4 F (36.3 C) (04/22 2343) Pulse Rate:  [72-115] 72 (04/22 2325) Resp:  [16-20] 16 (04/22 2325) BP: (123-196)/(45-76) 123/45 (04/22 2325) SpO2:  [95 %] 95 % (04/22 2343)  Intake/Output from previous day:  Intake/Output Summary (Last 24 hours) at 03/20/17 0748 Last data filed at 03/20/17 0600  Gross per 24 hour  Intake          2331.25 ml  Output                0 ml  Net          2331.25 ml    Intake/Output this shift: No intake/output data recorded.  Labs:  Recent Labs  03/17/17 1813 03/19/17 0330  HGB 14.7 10.2*    Recent Labs  03/17/17 1813 03/19/17 0330  WBC 6.3 8.9  RBC 4.62 3.22*  HCT 41.9 29.2*  PLT 145* 123*    Recent Labs  03/17/17 1813 03/19/17 0330  NA 137 141  K 4.0 4.8  CL 104 108  CO2 29 27  BUN 11 13  CREATININE 1.25* 1.38*  GLUCOSE 121* 159*  CALCIUM 9.2 8.3*   No results for input(s): LABPT, INR in the last 72 hours.   EXAM General - Patient is Confused and Lacking Extremity - Intact pulses distally No cellulitis present Compartment soft  Most proximal incision with scant bloody drainage. Dressing/Incision - Scant bloody drainage to proximal incision. Motor Function - intact, moving foot and toes well on exam.   Abdomen soft on exam with AM.  Past Medical History:  Diagnosis Date  . Hypertension   . Hyperthyroidism   . Kidney stone   . Memory loss     Assessment/Plan: 2 Days Post-Op Procedure(s)  (LRB): INTRAMEDULLARY (IM) NAIL INTERTROCHANTRIC (Right) Active Problems:   Intertrochanteric fracture of right hip (HCC)   Pressure injury of skin  Estimated body mass index is 19 kg/m as calculated from the following:   Height as of this encounter:  (1.676 m).   Weight as of this encounter: 53.4 kg (117 lb 11.2 oz). Advance diet Up with therapy Discharge to SNF when stable with Medicine.  Upon discharge from the hospital, continue with Lovenox  daily x14 days. Staple can be removed on 03/31/17. Follow-up with either Dr. Joice Lofts or Valeria Batman, PA-C with [facility] orthopaedics in 6 weeks for x-rays.  DVT Prophylaxis - Lovenox, Foot Pumps and TED hose Weight-Bearing as tolerated to Right leg  J. Horris Latino, PA-C Orthopaedic Surgery 03/20/2017, 7:48 AM

## 2017-03-20 NOTE — Progress Notes (Signed)
Physical Therapy Treatment Patient Details Name: Isaiah Mckenzie MRN: [id number] DOB: Sep 28, 1927 Today's Date: 03/20/2017    History of Present Illness Pt presented to the ED after a fall at home resulting in an intertrochanteric fracture of his R hip s/p R intermedullary nail pinning 03/18/17. Pt has PMH of HTN, hyperthyroidism, kidney stone, and advanced dementia.     PT Comments    Pt was resting in bed upon entry. Pt easily aroused by his name and touching his hand. Pt required max assist of 2 for bed mobility and min-mod assist of 2 for transfers. Pt intermittently followed basic commands and able to assist with bed mobility and stand from EOB x2. PT will continue to work on increasing activity tolerance and functional mobility during next session.     Follow Up Recommendations  SNF     Equipment Recommendations  Rolling walker with 5" wheels;3in1 (PT) (Pt has RW and 3in1 at home)    Recommendations for Other Services       Precautions / Restrictions Precautions Precautions: Fall Restrictions Weight Bearing Restrictions: Yes RLE Weight Bearing: Weight bearing as tolerated    Mobility  Bed Mobility Overal bed mobility: Needs Assistance Bed Mobility: Supine to Sit;Sit to Supine     Supine to sit: Max assist;+2 for physical assistance Sit to supine: Max assist;+2 for physical assistance   General bed mobility comments: Pt assisted with sitting EOB by pulling on bed rail and scooting toward the EOB after movement was initiated by assisted trunk and LE movement  Transfers Overall transfer level: Needs assistance Equipment used: Rolling walker (2 wheeled) Transfers: Sit to/from Stand (x2) Sit to Stand: Min assist;Mod assist;+2 physical assistance         General transfer comment: Pt required min assist on L and mod assist on R to stand; pt required v/c's and hand over hand assist to place hands on RW; Pt TTWB on R LE and cried out in pain on first trial and did not  put any weight through it on second trial; pt trunk was extremely flexed and required v/c's to stand up tall; pt exhibitied visual signs of pain and fatigue during both trials    Ambulation/Gait Ambulation/Gait assistance:  (No assessed d/t pt pain and fatigue)               Stairs            Wheelchair Mobility    Modified Rankin (Stroke Patients Only)       Balance Overall balance assessment: Needs assistance Sitting-balance support: Bilateral upper extremity supported;Feet supported Sitting balance-Leahy Scale: Poor Sitting balance - Comments: Pt initially resisted in sitting, indicating that he wanted to lay back down, but eventually sat EOB with min assist and able to sit up tall and look at himself in the mirror Postural control: Posterior lean Standing balance support: Bilateral upper extremity supported Standing balance-Leahy Scale: Poor Standing balance comment: see general transfer comments                            Cognition Arousal/Alertness: Lethargic (Pt responsive to his name and touch) Behavior During Therapy: Impulsive Overall Cognitive Status: History of cognitive impairments - at baseline                                 General Comments: Pt intermittently follows v/c's and responds to hand over  hand assistance; Pt prefers to be called [name], per his son and daughter      Exercises      General Comments        Pertinent Vitals/Pain Pain Assessment: Faces Faces Pain Scale: Hurts whole lot Pain Location: R hip Pain Descriptors / Indicators: Grimacing;Operative site guarding;Moaning Pain Intervention(s): Limited activity within patient's tolerance;Monitored during session;Repositioned (RN notified)    Home Living Family/patient expects to be discharged to:: Private residence Living Arrangements: Children Available Help at Discharge: Family;Available 24 hours/day Type of Home: House Home Access: Level entry    Home Layout: One level Home Equipment: Walker - 2 wheels;Bedside commode Additional Comments: Pt's son reports that the pt uses the bathroom intermittently, but he keeps the bed side commode in the room incase the pt needs to use it instead.    Prior Function Level of Independence: Needs assistance  Gait / Transfers Assistance Needed: Pt's son reports that he requires supervision with use of RW to ambulate around the home, and intermittently does not use the RW ADL's / Homemaking Assistance Needed: Pt's son assists with all ADL's and IADL's, but does report that the pt does what he can to help Comments: Pt's son and daughter report that the pt has had 3-4 falls within the past year, but this is the first fall to result in serious injury.   PT Goals (current goals can now be found in the care plan section) Acute Rehab PT Goals Patient Stated Goal: to get back to what he was doing before at home. PT Goal Formulation: With family Time For Goal Achievement: 04/03/17 Potential to Achieve Goals: Poor Progress towards PT goals: Progressing toward goals    Frequency    BID      PT Plan Current plan remains appropriate    Co-evaluation             End of Session Equipment Utilized During Treatment: Gait belt Activity Tolerance: Patient limited by fatigue;Patient limited by pain Patient left: in bed;with call bell/phone within reach;with bed alarm set;with SCD's reapplied;with restraints reapplied (with B LE elevated with pillow; with hand mitts reapplied) Nurse Communication: Mobility status (Pt's pain with activity) PT Visit Diagnosis: Unsteadiness on feet (R26.81);Other abnormalities of gait and mobility (R26.89);Muscle weakness (generalized) (M62.81);History of falling (Z91.81);Pain Pain - Right/Left: Right Pain - part of body: Hip     Time: 0322-0348 PT Time Calculation (min) (ACUTE ONLY): 26 min  Charges:  $Therapeutic Activity: 23-37 mins                    G Codes:          Navin Dogan, SPT 03/20/2017, 4:45 PM

## 2017-03-20 NOTE — Care Management Note (Signed)
Case Management Note  Patient Details  Name: Isaiah Mckenzie MRN: [id number] Date of Birth: [birth year]  Subjective/Objective:   PT pending. RNCM following for disposition.                 Action/Plan:   Expected Discharge Date:                  Expected Discharge Plan:     In-House Referral:     Discharge planning Services     Post Acute Care Choice:    Choice offered to:     DME Arranged:    DME Agency:     HH Arranged:    HH Agency:     Status of Service:     If discussed at [employer] of Stay Meetings, dates discussed:    Additional Comments:  Marily Memos, RN 03/20/2017, 11:30 AM

## 2017-03-20 NOTE — Clinical Social Work Note (Signed)
Clinical Social Work Assessment  Patient Details  Name: Isaiah Mckenzie MRN: [id number] Date of Birth: [birth year]  Date of referral:  03/20/17               Reason for consult:  Facility Placement                Permission sought to share information with:  Chartered certified accountant granted to share information::  Yes, Verbal Permission Granted  Name::      [place]::   [place]   Relationship::     Contact Information:     Housing/Transportation Living arrangements for the past 2 months:  [place] of Information:  Adult Children Patient Interpreter Needed:  None Criminal Activity/Legal Involvement Pertinent to Current Situation/Hospitalization:  No - Comment as needed Significant Relationships:  Adult Children Lives with:  Adult Children Do you feel safe going back to the place where you live?    Need for family participation in patient care:  Yes (Comment)  Care giving concerns:  Patient lives in [place] with his son Sonia Side.    Social Worker assessment / plan:  Holiday representative (CSW) received SNF consult. PT is recommending SNF. CSW met with patient and her daughter Hilda Blades was at bedside. Patient was not alert and oriented. CSW introduced self and explained role of CSW department. Per daughter patient lives in [place] with his son Sonia Side. Per daughter she lives right behind patient and Sonia Side. Per daughter patient has 24/7 supervision. CSW explained that PT is recommending SNF. Daughter is agreeable to SNF search in [place]. FL2 complete and faxed out. CSW presented bed offers and daughter and son chose [facility]. CSW sent [facility] admissions coordinator at [facility] a message making him aware of accepted bed offer. CSW will continue to follow and assist as needed.    Employment status:  Retired Nurse, adult PT Recommendations:  [place] / Referral to community resources:  [place]  Patient/Family's Response to care:  Patient's son and daughter are agreeable for patient to go to [facility].   Patient/Family's Understanding of and Emotional Response to Diagnosis, Current Treatment, and Prognosis:  Patient's family was very pleasant and thanked CSW for visit.   Emotional Assessment Appearance:  Appears stated age Attitude/Demeanor/Rapport:  Unable to Assess Affect (typically observed):  Unable to Assess Orientation:  Oriented to Self, Fluctuating Orientation (Suspected and/or reported Sundowners) Alcohol / Substance use:  Not Applicable Psych involvement (Current and /or in the community):  No (Comment)  Discharge Needs  Concerns to be addressed:  Discharge Planning Concerns Readmission within the last 30 days:  No Current discharge risk:  Dependent with Mobility, Cognitively Impaired, Chronically ill Barriers to Discharge:  Continued Medical Work up   [employer], Veronia Beets, LCSW 03/20/2017, 4:29 PM

## 2017-03-20 NOTE — Plan of Care (Signed)
Problem: Education: Goal: Knowledge of the prescribed therapeutic regimen will improve Outcome: Progressing Patient unable to verbally express understanding of treatment. Patient still refusing most treatment at his time. Patient is more calm with family members at the bedside.   Harvie Heck, RN

## 2017-03-21 ENCOUNTER — Encounter: Payer: Self-pay | Admitting: Surgery

## 2017-03-21 LAB — BASIC METABOLIC PANEL
Anion gap: 6 (ref 5–15)
BUN: 17 mg/dL (ref 6–20)
CALCIUM: 8.6 mg/dL — AB (ref 8.9–10.3)
CO2: 27 mmol/L (ref 22–32)
CREATININE: 1.03 mg/dL (ref 0.61–1.24)
Chloride: 104 mmol/L (ref 101–111)
GFR calc Af Amer: >60 mL/min (ref >60)
GFR calc non Af Amer: >60 mL/min (ref >60)
GLUCOSE: 113 mg/dL — AB (ref 65–99)
Potassium: 4 mmol/L (ref 3.5–5.1)
Sodium: 137 mmol/L (ref 135–145)

## 2017-03-21 MED ORDER — OXYCODONE HCL 5 MG PO TABS: 5.0000 mg | ORAL_TABLET | ORAL | 0 refills | Status: AC | PRN

## 2017-03-21 MED ORDER — ENOXAPARIN SODIUM 30 MG/0.3ML ~~LOC~~ SOLN: 30.0000 mg | SUBCUTANEOUS | Status: AC

## 2017-03-21 NOTE — Progress Notes (Signed)
Patient is medically stable for discharge today to [facility]. Per [facility] admissions coordinator at [facility], patient can come to room 40-B. Social work Investment banker, operational discharge orders in [place]. Social work Tax inspector attempted to meet with patient at bedside making him aware of above, but patient was asleep. Social work Tax inspector spoke to daughter Stanton Kidney and daughter-in-law Alvis Lemmings making them aware of patient's discharge today to [facility]. Daughter and daughter-in-law verbally agreed they understood. RN will call report and arrange EMS for transport. Please re-consult if future social work needs arise. Social work Administrator, arts off.   Ralene Bathe, Social Work Intern  [phone]

## 2017-03-21 NOTE — Progress Notes (Signed)
Physical Therapy Treatment Patient Details Name: Isaiah Mckenzie MRN: [id number] DOB: 1927/11/16 Today's Date: 03/21/2017    History of Present Illness Pt presented to the ED after a fall at home resulting in an intertrochanteric fracture of his R hip s/p R intermedullary nail pinning 03/18/17. Pt has PMH of HTN, hyperthyroidism, kidney stone, and advanced dementia.     PT Comments    Pt resting in bed with his son and daughter in the room upon entry. Pt family agreeable to PT session. Pt was very lethargic during session and only intermittently would respond to v/c's. Pt tolerated bed level exercises well, but limited by lethargy. PT will attempt functional mobility during next session as able. Current recommendations remain appropriate.    Follow Up Recommendations  SNF     Equipment Recommendations  Rolling walker with 5" wheels;3in1 (PT) (Pt has RW and 3in1 at home)    Recommendations for Other Services       Precautions / Restrictions Precautions Precautions: Fall Restrictions Weight Bearing Restrictions: Yes RLE Weight Bearing: Weight bearing as tolerated    Mobility  Bed Mobility               General bed mobility comments: Pt assisted with sliding his shoulders over and moving his hips over in bed to be reposistioned  Transfers Overall transfer level:  (Not attempted d/t pt being very lethargic)                  Ambulation/Gait                 Stairs            Wheelchair Mobility    Modified Rankin (Stroke Patients Only)       Balance                                            Cognition Arousal/Alertness: Lethargic (pt responsive to name and touch) Behavior During Therapy: Impulsive Overall Cognitive Status: History of cognitive impairments - at baseline                                 General Comments: Pt intermittently follows v/c's and responds to hand over hand assistance; Pt prefers  to be called [name], per his son and daughter      Exercises General Exercises - Lower Extremity Ankle Circles/Pumps: AROM;Both;5 reps;Supine (Pt responded to wiggle your feet back and forth, but only performed 5 reps before he no longer responded to the v/c's) Short Arc Quad: AAROM;Right;5 reps;Supine (PROM on the R for 5 reps before pt actively assisted with last 5 reps ) Heel Slides: AAROM;Right;5 reps;Supine (PROM on R for 3-4 reps before pt actively assisted with 5 reps) Hip ABduction/ADduction: PROM;Right;10 reps;Supine Straight Leg Raises: AAROM;Right;10 reps;Supine    General Comments        Pertinent Vitals/Pain Pain Assessment: Faces Faces Pain Scale: Hurts whole lot Pain Location: R hip Pain Descriptors / Indicators: Grimacing;Operative site guarding;Moaning Pain Intervention(s): Limited activity within patient's tolerance;Monitored during session;Repositioned;Premedicated before session    Home Living                      Prior Function            PT Goals (current goals can now be  found in the care plan section) Acute Rehab PT Goals Patient Stated Goal: to get back to what he was doing before at home. PT Goal Formulation: With family Time For Goal Achievement: 04/03/17 Potential to Achieve Goals: Poor Progress towards PT goals: Progressing toward goals    Frequency    BID      PT Plan Current plan remains appropriate    Co-evaluation             End of Session   Activity Tolerance: Patient limited by fatigue;Patient limited by lethargy Patient left: in bed;with bed alarm set;with SCD's reapplied;with call bell/phone within reach;with family/visitor present (Pt's son and daughter were in the room) Nurse Communication: Mobility status (Pt lethargy) PT Visit Diagnosis: Unsteadiness on feet (R26.81);Other abnormalities of gait and mobility (R26.89);Muscle weakness (generalized) (M62.81);History of falling (Z91.81);Pain Pain - Right/Left:  Right Pain - part of body: Hip     Time: 1214-1228 PT Time Calculation (min) (ACUTE ONLY): 14 min  Charges:                       G Codes:        Brylen Wagar, SPT 03/21/2017, 2:14 PM

## 2017-03-21 NOTE — Progress Notes (Signed)
Patient honeycomb saturated with serosanguinous drainage. Hip is bruising. MD aware and ordered for RN to change dressing. Dressing changed. Clean dry and intact. RN also applied ice pack to help reduce some of the swelling.   Harvie Heck, RN

## 2017-03-21 NOTE — Progress Notes (Signed)
  Subjective: 3 Days Post-Op Procedure(s) (LRB): INTRAMEDULLARY (IM) NAIL INTERTROCHANTRIC (Right) Patient did not verbalize a pain score to me this AM. Patient is well, and has had no acute complaints or problems.  Placed on medications for being combative. Plan is to go Skilled nursing facility after hospital stay. Negative for chest pain and shortness of breath Fever: no Gastrointestinal:negative for nausea and vomiting  Objective: Vital signs in last 24 hours: Temp:  [97.5 F (36.4 C)-98.4 F (36.9 C)] 97.5 F (36.4 C) (04/23 2309) Pulse Rate:  [109-121] 109 (04/23 2309) Resp:  [16-18] 18 (04/23 2309) BP: (131-155)/(67-84) 155/84 (04/23 2309) SpO2:  [94 %-95 %] 94 % (04/23 2309)  Intake/Output from previous day:  Intake/Output Summary (Last 24 hours) at 03/21/17 1045 Last data filed at 03/20/17 1826  Gross per 24 hour  Intake              520 ml  Output                0 ml  Net              520 ml    Intake/Output this shift: No intake/output data recorded.  Labs:  Recent Labs  03/19/17 0330  HGB 10.2*    Recent Labs  03/19/17 0330  WBC 8.9  RBC 3.22*  HCT 29.2*  PLT 123*    Recent Labs  03/19/17 0330 03/21/17 0441  NA 141 137  K 4.8 4.0  CL 108 104  CO2 27 27  BUN 13 17  CREATININE 1.38* 1.03  GLUCOSE 159* 113*  CALCIUM 8.3* 8.6*   No results for input(s): LABPT, INR in the last 72 hours.   EXAM General - Patient is Confused and Lacking  Resting when I entered the room. Extremity - Intact pulses distally No cellulitis present Compartment soft  Most proximal incision with scant bloody drainage. Dressing/Incision - Scant bloody drainage to proximal incision. Unable to full evaluate due to the patient laying on his right side. Motor Function - intact, moving foot and toes well on exam.   Abdomen soft on exam with AM.  Past Medical History:  Diagnosis Date  . Hypertension   . Hyperthyroidism   . Kidney stone   . Memory loss      Assessment/Plan: 3 Days Post-Op Procedure(s) (LRB): INTRAMEDULLARY (IM) NAIL INTERTROCHANTRIC (Right) Active Problems:   Intertrochanteric fracture of right hip (HCC)   Pressure injury of skin  Estimated body mass index is 19 kg/m as calculated from the following:   Height as of this encounter:  (1.676 m).   Weight as of this encounter: 53.4 kg (117 lb 11.2 oz). Advance diet Up with therapy Discharge to SNF when stable with Medicine.  Upon discharge from the hospital, continue with Lovenox  daily x14 days. Staple can be removed on 03/31/17. Follow-up with either Dr. Joice Lofts or Valeria Batman, PA-C with [facility] orthopaedics in 6 weeks for x-rays.  DVT Prophylaxis - Lovenox, Foot Pumps and TED hose Weight-Bearing as tolerated to Right leg  J. Horris Latino, PA-C Orthopaedic Surgery 03/21/2017, 10:45 AM

## 2017-03-21 NOTE — Care Management Important Message (Signed)
Important Message  Patient Details  Name: Isaiah Mckenzie MRN: [id number] Date of Birth: 06/04/1927   Medicare Important Message Given:  Yes    Marily Memos, RN 03/21/2017, 10:36 AM

## 2017-03-21 NOTE — Progress Notes (Signed)
RN called report for RN at [employer]. Patient alert and disoriented X4 per new baseline. Has new honeycomb dressing. EMS caledd for pick up.   Harvie Heck, RN

## 2017-03-21 NOTE — Clinical Social Work Placement (Signed)
   CLINICAL SOCIAL WORK PLACEMENT  NOTE  Date:  03/21/2017  Patient Details  Name: Isaiah Mckenzie MRN: [id number] Date of Birth: [birth year]  Clinical Social Work is seeking post-discharge placement for this patient at the Skilled  Nursing Facility level of care (*CSW will initial, date and re-position this form in  chart as items are completed):  Yes   Patient/family provided with [facility] Clinical Social Work Department's list of facilities offering this level of care within the geographic area requested by the patient (or if unable, by the patient's family).  Yes   Patient/family informed of their freedom to choose among providers that offer the needed level of care, that participate in Medicare, Medicaid or managed care program needed by the patient, have an available bed and are willing to accept the patient.  Yes   Patient/family informed of [facility]'s ownership interest in [facility] and [facility], as well as of the fact that they are under no obligation to receive care at these facilities.  PASRR submitted to EDS on 03/20/17     PASRR number received on 03/20/17     Existing PASRR number confirmed on       FL2 transmitted to all facilities in geographic area requested by pt/family on 03/20/17     FL2 transmitted to all facilities within larger geographic area on       Patient informed that his/her managed care company has contracts with or will negotiate with certain facilities, including the following:        Yes   Patient/family informed of bed offers received.  Patient chooses bed at  [facility] )     Physician recommends and patient chooses bed at      Patient to be transferred to  [facility]) on 03/21/17.  Patient to be transferred to facility by  [facility])     Patient family notified on 03/21/17 of transfer.  Name of family member notified:   (Patient's daughter Stanton Kidney and daughter-in-law Alvis Lemmings are aware of  patient's discharge today.)     PHYSICIAN       Additional Comment:    _______________________________________________ Ralene Bathe, Student-Social Work 03/21/2017, 2:18 PM

## 2017-03-21 NOTE — Discharge Summary (Signed)
[facility] -  at [facility]   PATIENT NAME: Isaiah Mckenzie    MR#:  [id number]  DATE OF BIRTH:  17-May-1927  DATE OF ADMISSION:  03/17/2017 ADMITTING PHYSICIAN: Tonye Royalty, DO  DATE OF DISCHARGE: 03/21/2017  PRIMARY CARE PHYSICIAN: Marina Goodell, MD    ADMISSION DIAGNOSIS:  Fall [W19.XXXA] Fall, initial encounter [id number]] Intertrochanteric fracture of right femur, closed, initial encounter (HCC) [S72.141A]  DISCHARGE DIAGNOSIS:  Active Problems:   Intertrochanteric fracture of right hip (HCC)   Pressure injury of skin   SECONDARY DIAGNOSIS:   Past Medical History:  Diagnosis Date  . Hypertension   . Hyperthyroidism   . Kidney stone   . Memory loss     HOSPITAL COURSE:   83 year old male with past medical history of dementia, BPH who presented to the hospital for a fall and noted to have a right hip fracture.  1. Status post fall and right hip fracture-patient is status post right hip pinning POD # 3. - pain is well controlled on Oral Oxycodone.  Tolerating PT and will d/c to SNF for ongoing rehab.  - follow up with Ortho in a few weeks.   2. BPH-no evidence of urinary retention. - no acute issue while in hospital.   3. HTN - had some labile BP due to pain but at home not on any anti-HTN.  - hold off on treatment at this time and can be further followed as outpatient.   4. CKD Stage III - Cr. At baseline.  No acute issue.    5. Advanced Dementia with agitation - pt. Did require some PRN Haldol for agitation but much improved now.   D/c to SN for ongoing rehab post hip fracture.   DISCHARGE CONDITIONS:   Stable  CONSULTS OBTAINED:  Treatment Team:  Christena Flake, MD Dalia Heading, MD  DRUG ALLERGIES:  No Known Allergies  DISCHARGE MEDICATIONS:   Allergies as of 03/21/2017   No Known Allergies     Medication List    STOP taking these medications   tamsulosin 0.4 MG Caps capsule Commonly known as:   FLOMAX     TAKE these medications   aspirin EC 81 MG tablet Take 1 tablet by mouth daily. Reported on 02/19/2016   enoxaparin 30 MG/0.3ML injection Commonly known as:  LOVENOX Inject 0.3 mLs (30 mg total) into the skin daily. Start taking on:  03/22/2017   oxyCODONE 5 MG immediate release tablet Commonly known as:  Oxy IR/ROXICODONE Take 1-2 tablets (5-10 mg total) by mouth every 3 (three) hours as needed for breakthrough pain.         DISCHARGE INSTRUCTIONS:   DIET:  Regular diet  DISCHARGE CONDITION:  Stable  ACTIVITY:  Activity as tolerated  OXYGEN:  Home Oxygen: No.   Oxygen Delivery: room air  DISCHARGE LOCATION:  nursing home   If you experience worsening of your admission symptoms, develop shortness of breath, life threatening emergency, suicidal or homicidal thoughts you must seek medical attention immediately by calling 911 or calling your MD immediately  if symptoms less severe.  You Must read complete instructions/literature along with all the possible adverse reactions/side effects for all the Medicines you take and that have been prescribed to you. Take any new Medicines after you have completely understood and accpet all the possible adverse reactions/side effects.   Please note  You were cared for by a hospitalist during your hospital stay. If you have any questions about your discharge medications  or the care you received while you were in the hospital after you are discharged, you can call the unit and asked to speak with the hospitalist on call if the hospitalist that took care of you is not available. Once you are discharged, your primary care physician will handle any further medical issues. Please note that NO REFILLS for any discharge medications will be authorized once you are discharged, as it is imperative that you return to your primary care physician (or establish a relationship with a primary care physician if you do not have one) for your  aftercare needs so that they can reassess your need for medications and monitor your lab values.     Today   More awake today and follows some simple commands.  No agitation or sundowning overnight.    VITAL SIGNS:  Blood pressure (!) 155/84, pulse (!) 109, temperature 97.5 F (36.4 C), temperature source Axillary, resp. rate 18, height  (1.676 m), weight 53.4 kg (117 lb 11.2 oz), SpO2 94 %.  I/O:   Intake/Output Summary (Last 24 hours) at 03/21/17 1027 Last data filed at 03/20/17 1826  Gross per 24 hour  Intake              520 ml  Output                0 ml  Net              520 ml    PHYSICAL EXAMINATION:   GENERAL:  83 y.o.-year-old patient lying in bed in NAD.  EYES: Pupils equal, round, reactive to light. No scleral icterus. Extraocular muscles intact.  HEENT: Head atraumatic, normocephalic. Oropharynx and nasopharynx clear.  NECK:  Supple, no jugular venous distention. No thyroid enlargement, no tenderness.  LUNGS: Normal breath sounds bilaterally, no wheezing, rales, rhonchi. No use of accessory muscles of respiration.  CARDIOVASCULAR: S1, S2 normal. No murmurs, rubs, or gallops.  ABDOMEN: Soft, nontender, nondistended. Bowel sounds present. No organomegaly or mass.  EXTREMITIES: No cyanosis, clubbing or edema b/l.   Right Hip dressing from surgery.  NEUROLOGIC: Cranial nerves II through XII are intact. No focal Motor or sensory deficits b/l.  Globally weak but follows some simple commands.   PSYCHIATRIC: The patient is alert and oriented x 1.   SKIN: No obvious rash, lesion, or ulcer.   DATA REVIEW:   CBC  Recent Labs Lab 03/19/17 0330  WBC 8.9  HGB 10.2*  HCT 29.2*  PLT 123*    Chemistries   Recent Labs Lab 03/21/17 0441  NA 137  K 4.0  CL 104  CO2 27  GLUCOSE 113*  BUN 17  CREATININE 1.03  CALCIUM 8.6*    Cardiac Enzymes  Recent Labs Lab 03/17/17 1816  TROPONINI <0.03    Microbiology Results  Results for orders placed or  performed during the hospital encounter of 03/17/17  Surgical PCR screen     Status: None   Collection Time: 03/17/17 10:21 PM  Result Value Ref Range Status   MRSA, PCR NEGATIVE NEGATIVE Final   Staphylococcus aureus NEGATIVE NEGATIVE Final    Comment:        The Xpert SA Assay (FDA approved for NASAL specimens in patients over 73 years of age), is one component of a comprehensive surveillance program.  Test performance has been validated by [facility] for patients greater than or equal to 69 year old. It is not intended to diagnose infection nor to guide or monitor treatment.  RADIOLOGY:  No results found.    Management plans discussed with the patient, family and they are in agreement.  CODE STATUS:     Code Status Orders        Start     Ordered   03/17/17 2218  Full code  Continuous     03/17/17 2217    Code Status History    Date Active Date Inactive Code Status Order ID Comments User Context   01/26/2016  6:00 PM 01/28/2016  8:11 PM Full Code [id number]  Shaune Pollack, MD Inpatient      TOTAL TIME TAKING CARE OF THIS PATIENT: 40 minutes.    Houston Siren M.D on 03/21/2017 at 10:27 AM  Between 7am to 6pm - Pager - [phone]  After 6pm go to [email or web address] - Social research officer, government  [employer]  Hospitalists  Office  [phone]  CC: Primary care physician; [facility], Madaline Guthrie, MD

## 2017-05-28 DEATH — deceased

## 2018-10-18 IMAGING — CR DG PELVIS 1-2V
1 series · 1 of 1 positions shown · non-contrast
Comparison: CT of the abdomen and pelvis from 01/26/2016

CLINICAL DATA: Acute onset of right hip pain and deformity after
fall at home. Found on floor. Initial encounter.

EXAM:
PELVIS - 1-2 VIEW

[pelvis ap]
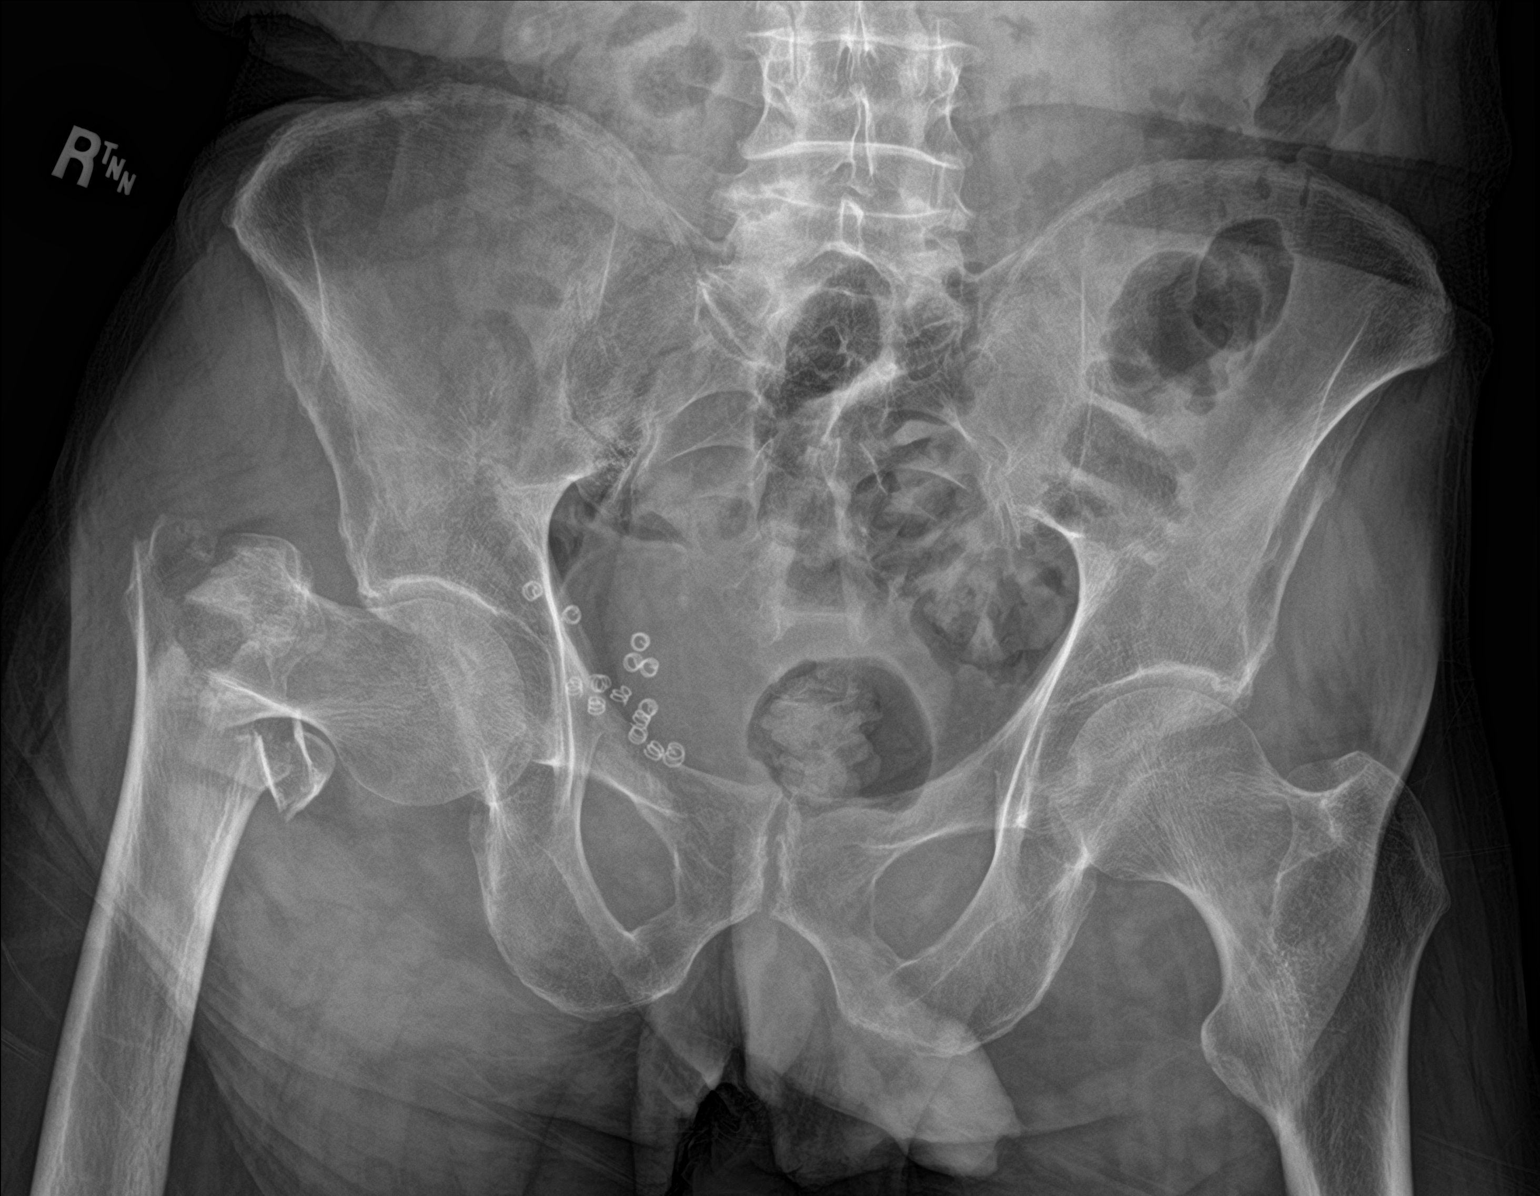

[1 of 1 positions shown; findings below may reference images not displayed]

FINDINGS: There is a comminuted right femoral intertrochanteric fracture, with
displaced lesser trochanteric fragments. There is superior
displacement of the distal femur. The right femoral head remains
seated at the acetabulum. Surrounding soft tissue swelling is noted.

The left hip joint is unremarkable in appearance. The sacroiliac
joints are grossly unremarkable. The visualized bowel gas pattern is
unremarkable. A mesh is seen at the right hemipelvis.
IMPRESSION: Comminuted right femoral intertrochanteric fracture, with displaced
lesser trochanteric fragments. Superior displacement of the distal
femur.
